# Patient Record
Sex: Female | Born: 1945
Health system: Southern US, Community
[De-identification: ages and names within clinical notes are randomized; demographics above are authoritative.]

## PROBLEM LIST (undated history)

## (undated) DIAGNOSIS — I672 Cerebral atherosclerosis: Secondary | ICD-10-CM

## (undated) DIAGNOSIS — G9340 Encephalopathy, unspecified: Secondary | ICD-10-CM

## (undated) DIAGNOSIS — K2960 Other gastritis without bleeding: Secondary | ICD-10-CM

## (undated) DIAGNOSIS — R631 Polydipsia: Secondary | ICD-10-CM

## (undated) DIAGNOSIS — F419 Anxiety disorder, unspecified: Secondary | ICD-10-CM

## (undated) DIAGNOSIS — I8310 Varicose veins of unspecified lower extremity with inflammation: Secondary | ICD-10-CM

## (undated) DIAGNOSIS — K5190 Ulcerative colitis, unspecified, without complications: Secondary | ICD-10-CM

## (undated) DIAGNOSIS — G4700 Insomnia, unspecified: Secondary | ICD-10-CM

## (undated) DIAGNOSIS — Z8673 Personal history of transient ischemic attack (TIA), and cerebral infarction without residual deficits: Secondary | ICD-10-CM

## (undated) DIAGNOSIS — R569 Unspecified convulsions: Secondary | ICD-10-CM

## (undated) DIAGNOSIS — I639 Cerebral infarction, unspecified: Secondary | ICD-10-CM

## (undated) DIAGNOSIS — F329 Major depressive disorder, single episode, unspecified: Secondary | ICD-10-CM

## (undated) DIAGNOSIS — G43909 Migraine, unspecified, not intractable, without status migrainosus: Secondary | ICD-10-CM

## (undated) DIAGNOSIS — F32A Depression, unspecified: Secondary | ICD-10-CM

## (undated) DIAGNOSIS — I63232 Cerebral infarction due to unspecified occlusion or stenosis of left carotid arteries: Secondary | ICD-10-CM

## (undated) DIAGNOSIS — I10 Essential (primary) hypertension: Secondary | ICD-10-CM

## (undated) DIAGNOSIS — I700 Atherosclerosis of aorta: Secondary | ICD-10-CM

## (undated) DIAGNOSIS — E785 Hyperlipidemia, unspecified: Secondary | ICD-10-CM

## (undated) DIAGNOSIS — F418 Other specified anxiety disorders: Secondary | ICD-10-CM

## (undated) DIAGNOSIS — R4701 Aphasia: Secondary | ICD-10-CM

## (undated) DIAGNOSIS — E871 Hypo-osmolality and hyponatremia: Secondary | ICD-10-CM

## (undated) DIAGNOSIS — E872 Acidosis: Secondary | ICD-10-CM

## (undated) DIAGNOSIS — F54 Psychological and behavioral factors associated with disorders or diseases classified elsewhere: Secondary | ICD-10-CM

## (undated) DIAGNOSIS — I679 Cerebrovascular disease, unspecified: Secondary | ICD-10-CM

## (undated) DIAGNOSIS — R4182 Altered mental status, unspecified: Secondary | ICD-10-CM

## (undated) HISTORY — PX: SHOULDER SURGERY: SHX246

## (undated) HISTORY — DX: Other specified anxiety disorders: F41.8

## (undated) HISTORY — DX: Polydipsia: R63.1

## (undated) HISTORY — DX: Altered mental status, unspecified: R41.82

## (undated) HISTORY — PX: OTHER SURGICAL HISTORY: SHX169

## (undated) HISTORY — DX: Atherosclerosis of aorta: I70.0

## (undated) HISTORY — DX: Cerebral infarction due to unspecified occlusion or stenosis of left carotid arteries: I63.232

## (undated) HISTORY — DX: Hyperlipidemia, unspecified: E78.5

## (undated) HISTORY — DX: Varicose veins of unspecified lower extremity with inflammation: I83.10

## (undated) HISTORY — DX: Cerebral atherosclerosis: I67.2

## (undated) HISTORY — DX: Hypo-osmolality and hyponatremia: E87.1

## (undated) HISTORY — PX: KNEE SURGERY: SHX244

## (undated) HISTORY — DX: Unspecified convulsions: R56.9

## (undated) HISTORY — DX: Ulcerative colitis, unspecified, without complications: K51.90

## (undated) HISTORY — PX: CHOLECYSTECTOMY: SHX55

## (undated) HISTORY — PX: COLOSTOMY: SHX63

## (undated) HISTORY — PX: BACK SURGERY: SHX140

## (undated) HISTORY — PX: UPPER GASTROINTESTINAL ENDOSCOPY: SHX188

## (undated) HISTORY — DX: Acidosis: E87.2

## (undated) HISTORY — DX: Insomnia, unspecified: G47.00

## (undated) HISTORY — DX: Other gastritis without bleeding: K29.60

## (undated) HISTORY — PX: ABDOMINAL HYSTERECTOMY: SHX81

## (undated) HISTORY — PX: ELBOW SURGERY: SHX618

## (undated) HISTORY — DX: Psychological and behavioral factors associated with disorders or diseases classified elsewhere: F54

## (undated) HISTORY — DX: Cerebrovascular disease, unspecified: I67.9

## (undated) HISTORY — DX: Migraine, unspecified, not intractable, without status migrainosus: G43.909

## (undated) HISTORY — DX: Personal history of transient ischemic attack (TIA), and cerebral infarction without residual deficits: Z86.73

## (undated) HISTORY — DX: Aphasia: R47.01

## (undated) HISTORY — DX: Encephalopathy, unspecified: G93.40

---

## 1988-12-31 HISTORY — PX: BACK SURGERY: SHX140

## 1998-04-25 ENCOUNTER — Ambulatory Visit (HOSPITAL_BASED_OUTPATIENT_CLINIC_OR_DEPARTMENT_OTHER): Admission: RE | Admit: 1998-04-25 | Discharge: 1998-04-25 | Payer: Self-pay | Admitting: Orthopedic Surgery

## 2000-06-18 ENCOUNTER — Other Ambulatory Visit: Admission: RE | Admit: 2000-06-18 | Discharge: 2000-06-18 | Payer: Self-pay | Admitting: Gynecology

## 2002-08-17 ENCOUNTER — Other Ambulatory Visit: Admission: RE | Admit: 2002-08-17 | Discharge: 2002-08-17 | Payer: Self-pay | Admitting: Obstetrics and Gynecology

## 2002-12-31 HISTORY — PX: SHOULDER SURGERY: SHX246

## 2003-02-15 ENCOUNTER — Ambulatory Visit (HOSPITAL_BASED_OUTPATIENT_CLINIC_OR_DEPARTMENT_OTHER): Admission: RE | Admit: 2003-02-15 | Discharge: 2003-02-15 | Payer: Self-pay | Admitting: Orthopedic Surgery

## 2004-08-25 ENCOUNTER — Other Ambulatory Visit: Admission: RE | Admit: 2004-08-25 | Discharge: 2004-08-25 | Payer: Self-pay | Admitting: Obstetrics and Gynecology

## 2014-09-30 DIAGNOSIS — I639 Cerebral infarction, unspecified: Secondary | ICD-10-CM

## 2014-09-30 HISTORY — DX: Cerebral infarction, unspecified: I63.9

## 2014-11-12 DIAGNOSIS — I63232 Cerebral infarction due to unspecified occlusion or stenosis of left carotid arteries: Secondary | ICD-10-CM

## 2014-11-12 DIAGNOSIS — G43909 Migraine, unspecified, not intractable, without status migrainosus: Secondary | ICD-10-CM

## 2014-11-12 HISTORY — DX: Migraine, unspecified, not intractable, without status migrainosus: G43.909

## 2014-11-12 HISTORY — DX: Cerebral infarction due to unspecified occlusion or stenosis of left carotid arteries: I63.232

## 2015-01-18 DIAGNOSIS — H01004 Unspecified blepharitis left upper eyelid: Secondary | ICD-10-CM | POA: Diagnosis not present

## 2015-06-13 DIAGNOSIS — H53453 Other localized visual field defect, bilateral: Secondary | ICD-10-CM | POA: Diagnosis not present

## 2015-07-10 DIAGNOSIS — I10 Essential (primary) hypertension: Secondary | ICD-10-CM | POA: Diagnosis not present

## 2015-07-10 DIAGNOSIS — R072 Precordial pain: Secondary | ICD-10-CM | POA: Diagnosis not present

## 2015-07-10 DIAGNOSIS — E780 Pure hypercholesterolemia: Secondary | ICD-10-CM | POA: Diagnosis not present

## 2015-07-10 DIAGNOSIS — K219 Gastro-esophageal reflux disease without esophagitis: Secondary | ICD-10-CM | POA: Diagnosis not present

## 2015-07-10 DIAGNOSIS — I6930 Unspecified sequelae of cerebral infarction: Secondary | ICD-10-CM | POA: Diagnosis not present

## 2015-07-10 DIAGNOSIS — Z7982 Long term (current) use of aspirin: Secondary | ICD-10-CM | POA: Diagnosis not present

## 2015-07-10 DIAGNOSIS — Z87891 Personal history of nicotine dependence: Secondary | ICD-10-CM | POA: Diagnosis not present

## 2015-07-10 DIAGNOSIS — R079 Chest pain, unspecified: Secondary | ICD-10-CM | POA: Diagnosis not present

## 2015-07-10 DIAGNOSIS — Z79899 Other long term (current) drug therapy: Secondary | ICD-10-CM | POA: Diagnosis not present

## 2015-07-11 DIAGNOSIS — R079 Chest pain, unspecified: Secondary | ICD-10-CM | POA: Diagnosis not present

## 2015-07-11 DIAGNOSIS — K219 Gastro-esophageal reflux disease without esophagitis: Secondary | ICD-10-CM | POA: Diagnosis not present

## 2015-07-11 DIAGNOSIS — I6930 Unspecified sequelae of cerebral infarction: Secondary | ICD-10-CM | POA: Diagnosis not present

## 2015-07-11 DIAGNOSIS — E780 Pure hypercholesterolemia: Secondary | ICD-10-CM | POA: Diagnosis not present

## 2015-07-14 DIAGNOSIS — G43709 Chronic migraine without aura, not intractable, without status migrainosus: Secondary | ICD-10-CM | POA: Diagnosis not present

## 2015-07-14 DIAGNOSIS — I63232 Cerebral infarction due to unspecified occlusion or stenosis of left carotid arteries: Secondary | ICD-10-CM | POA: Diagnosis not present

## 2015-07-14 DIAGNOSIS — R42 Dizziness and giddiness: Secondary | ICD-10-CM | POA: Diagnosis not present

## 2015-07-25 DIAGNOSIS — R748 Abnormal levels of other serum enzymes: Secondary | ICD-10-CM | POA: Diagnosis not present

## 2015-07-25 DIAGNOSIS — E780 Pure hypercholesterolemia: Secondary | ICD-10-CM | POA: Diagnosis not present

## 2015-07-25 DIAGNOSIS — R0789 Other chest pain: Secondary | ICD-10-CM | POA: Diagnosis not present

## 2015-07-25 DIAGNOSIS — I10 Essential (primary) hypertension: Secondary | ICD-10-CM | POA: Diagnosis not present

## 2015-07-30 DIAGNOSIS — K760 Fatty (change of) liver, not elsewhere classified: Secondary | ICD-10-CM | POA: Diagnosis not present

## 2015-07-30 DIAGNOSIS — Z9049 Acquired absence of other specified parts of digestive tract: Secondary | ICD-10-CM | POA: Diagnosis not present

## 2015-10-31 DIAGNOSIS — F419 Anxiety disorder, unspecified: Secondary | ICD-10-CM | POA: Diagnosis not present

## 2015-11-07 DIAGNOSIS — G43709 Chronic migraine without aura, not intractable, without status migrainosus: Secondary | ICD-10-CM | POA: Diagnosis not present

## 2015-11-07 DIAGNOSIS — I63232 Cerebral infarction due to unspecified occlusion or stenosis of left carotid arteries: Secondary | ICD-10-CM | POA: Diagnosis not present

## 2015-11-08 DIAGNOSIS — F419 Anxiety disorder, unspecified: Secondary | ICD-10-CM | POA: Diagnosis not present

## 2015-11-22 DIAGNOSIS — F419 Anxiety disorder, unspecified: Secondary | ICD-10-CM | POA: Diagnosis not present

## 2015-11-22 DIAGNOSIS — F410 Panic disorder [episodic paroxysmal anxiety] without agoraphobia: Secondary | ICD-10-CM | POA: Diagnosis not present

## 2015-12-02 DIAGNOSIS — F419 Anxiety disorder, unspecified: Secondary | ICD-10-CM | POA: Diagnosis not present

## 2015-12-05 DIAGNOSIS — F419 Anxiety disorder, unspecified: Secondary | ICD-10-CM | POA: Diagnosis not present

## 2015-12-08 ENCOUNTER — Inpatient Hospital Stay (HOSPITAL_COMMUNITY): Payer: Medicare PPO

## 2015-12-08 ENCOUNTER — Encounter (HOSPITAL_COMMUNITY): Payer: Self-pay | Admitting: *Deleted

## 2015-12-08 ENCOUNTER — Emergency Department (HOSPITAL_COMMUNITY): Payer: Medicare PPO

## 2015-12-08 ENCOUNTER — Inpatient Hospital Stay (HOSPITAL_COMMUNITY)
Admission: EM | Admit: 2015-12-08 | Discharge: 2015-12-13 | DRG: 101 | Disposition: A | Discharge status: Home Health | Payer: Medicare PPO | Attending: Internal Medicine | Admitting: Internal Medicine

## 2015-12-08 DIAGNOSIS — R4182 Altered mental status, unspecified: Secondary | ICD-10-CM | POA: Diagnosis not present

## 2015-12-08 DIAGNOSIS — F419 Anxiety disorder, unspecified: Secondary | ICD-10-CM | POA: Diagnosis present

## 2015-12-08 DIAGNOSIS — S01512A Laceration without foreign body of oral cavity, initial encounter: Secondary | ICD-10-CM | POA: Diagnosis not present

## 2015-12-08 DIAGNOSIS — N179 Acute kidney failure, unspecified: Secondary | ICD-10-CM | POA: Diagnosis present

## 2015-12-08 DIAGNOSIS — R4701 Aphasia: Secondary | ICD-10-CM | POA: Diagnosis not present

## 2015-12-08 DIAGNOSIS — F418 Other specified anxiety disorders: Secondary | ICD-10-CM | POA: Diagnosis not present

## 2015-12-08 DIAGNOSIS — I6789 Other cerebrovascular disease: Secondary | ICD-10-CM | POA: Diagnosis not present

## 2015-12-08 DIAGNOSIS — E871 Hypo-osmolality and hyponatremia: Secondary | ICD-10-CM

## 2015-12-08 DIAGNOSIS — F54 Psychological and behavioral factors associated with disorders or diseases classified elsewhere: Secondary | ICD-10-CM

## 2015-12-08 DIAGNOSIS — Z7982 Long term (current) use of aspirin: Secondary | ICD-10-CM | POA: Diagnosis not present

## 2015-12-08 DIAGNOSIS — I6782 Cerebral ischemia: Secondary | ICD-10-CM | POA: Diagnosis not present

## 2015-12-08 DIAGNOSIS — D72829 Elevated white blood cell count, unspecified: Secondary | ICD-10-CM | POA: Diagnosis not present

## 2015-12-08 DIAGNOSIS — F329 Major depressive disorder, single episode, unspecified: Secondary | ICD-10-CM | POA: Diagnosis present

## 2015-12-08 DIAGNOSIS — E872 Acidosis, unspecified: Secondary | ICD-10-CM

## 2015-12-08 DIAGNOSIS — R569 Unspecified convulsions: Secondary | ICD-10-CM

## 2015-12-08 DIAGNOSIS — R400 Somnolence: Secondary | ICD-10-CM | POA: Diagnosis not present

## 2015-12-08 DIAGNOSIS — G9340 Encephalopathy, unspecified: Secondary | ICD-10-CM | POA: Diagnosis present

## 2015-12-08 DIAGNOSIS — X58XXXA Exposure to other specified factors, initial encounter: Secondary | ICD-10-CM | POA: Diagnosis present

## 2015-12-08 DIAGNOSIS — I10 Essential (primary) hypertension: Secondary | ICD-10-CM | POA: Diagnosis present

## 2015-12-08 DIAGNOSIS — I69319 Unspecified symptoms and signs involving cognitive functions following cerebral infarction: Secondary | ICD-10-CM

## 2015-12-08 DIAGNOSIS — M6282 Rhabdomyolysis: Secondary | ICD-10-CM | POA: Diagnosis present

## 2015-12-08 DIAGNOSIS — R0602 Shortness of breath: Secondary | ICD-10-CM

## 2015-12-08 DIAGNOSIS — E860 Dehydration: Secondary | ICD-10-CM | POA: Diagnosis present

## 2015-12-08 DIAGNOSIS — Z7902 Long term (current) use of antithrombotics/antiplatelets: Secondary | ICD-10-CM | POA: Diagnosis not present

## 2015-12-08 DIAGNOSIS — G40901 Epilepsy, unspecified, not intractable, with status epilepticus: Principal | ICD-10-CM | POA: Diagnosis present

## 2015-12-08 DIAGNOSIS — R402421 Glasgow coma scale score 9-12, in the field [EMT or ambulance]: Secondary | ICD-10-CM | POA: Diagnosis not present

## 2015-12-08 DIAGNOSIS — R401 Stupor: Secondary | ICD-10-CM | POA: Diagnosis not present

## 2015-12-08 DIAGNOSIS — R631 Polydipsia: Secondary | ICD-10-CM | POA: Diagnosis present

## 2015-12-08 DIAGNOSIS — Z79899 Other long term (current) drug therapy: Secondary | ICD-10-CM | POA: Diagnosis not present

## 2015-12-08 HISTORY — DX: Other specified anxiety disorders: F41.8

## 2015-12-08 HISTORY — DX: Major depressive disorder, single episode, unspecified: F32.9

## 2015-12-08 HISTORY — DX: Hypo-osmolality and hyponatremia: E87.1

## 2015-12-08 HISTORY — DX: Depression, unspecified: F32.A

## 2015-12-08 HISTORY — DX: Acidosis: E87.2

## 2015-12-08 HISTORY — DX: Essential (primary) hypertension: I10

## 2015-12-08 HISTORY — DX: Cerebral infarction, unspecified: I63.9

## 2015-12-08 HISTORY — DX: Acidosis, unspecified: E87.20

## 2015-12-08 HISTORY — DX: Anxiety disorder, unspecified: F41.9

## 2015-12-08 HISTORY — DX: Encephalopathy, unspecified: G93.40

## 2015-12-08 HISTORY — DX: Psychological and behavioral factors associated with disorders or diseases classified elsewhere: F54

## 2015-12-08 HISTORY — DX: Altered mental status, unspecified: R41.82

## 2015-12-08 LAB — ETHANOL

## 2015-12-08 LAB — COMPREHENSIVE METABOLIC PANEL
ALT: 57 U/L — AB (ref 14–54)
AST: 42 U/L — AB (ref 15–41)
Albumin: 4.5 g/dL (ref 3.5–5.0)
Alkaline Phosphatase: 79 U/L (ref 38–126)
Anion gap: 13 (ref 5–15)
BILIRUBIN TOTAL: 0.5 mg/dL (ref 0.3–1.2)
BUN: 13 mg/dL (ref 6–20)
CALCIUM: 9.5 mg/dL (ref 8.9–10.3)
CO2: 20 mmol/L — ABNORMAL LOW (ref 22–32)
CREATININE: 0.8 mg/dL (ref 0.44–1.00)
Chloride: 88 mmol/L — ABNORMAL LOW (ref 101–111)
GFR calc Af Amer: >60 mL/min (ref >60)
Glucose, Bld: 201 mg/dL — ABNORMAL HIGH (ref 65–99)
Potassium: 3.8 mmol/L (ref 3.5–5.1)
SODIUM: 121 mmol/L — AB (ref 135–145)
TOTAL PROTEIN: 8.2 g/dL — AB (ref 6.5–8.1)

## 2015-12-08 LAB — URINALYSIS, ROUTINE W REFLEX MICROSCOPIC
Bilirubin Urine: NEGATIVE
GLUCOSE, UA: 100 mg/dL — AB
Ketones, ur: NEGATIVE mg/dL
LEUKOCYTES UA: NEGATIVE
Nitrite: NEGATIVE
PH: 5.5 (ref 5.0–8.0)
PROTEIN: NEGATIVE mg/dL
Specific Gravity, Urine: 1.011 (ref 1.005–1.030)

## 2015-12-08 LAB — CBC
HCT: 33.7 % — ABNORMAL LOW (ref 36.0–46.0)
Hemoglobin: 12.3 g/dL (ref 12.0–15.0)
MCH: 29.8 pg (ref 26.0–34.0)
MCHC: 36.5 g/dL — AB (ref 30.0–36.0)
MCV: 81.6 fL (ref 78.0–100.0)
Platelets: 337 10*3/uL (ref 150–400)
RBC: 4.13 MIL/uL (ref 3.87–5.11)
RDW: 12.2 % (ref 11.5–15.5)
WBC: 16.6 10*3/uL — ABNORMAL HIGH (ref 4.0–10.5)

## 2015-12-08 LAB — URINE MICROSCOPIC-ADD ON: WBC, UA: NONE SEEN WBC/hpf (ref 0–5)

## 2015-12-08 LAB — POCT I-STAT TROPONIN I: TROPONIN I, POC: 0.01 ng/mL (ref 0.00–0.08)

## 2015-12-08 LAB — I-STAT CHEM 8, ED
BUN: 16 mg/dL (ref 6–20)
CALCIUM ION: 1.12 mmol/L — AB (ref 1.13–1.30)
Chloride: 88 mmol/L — ABNORMAL LOW (ref 101–111)
Creatinine, Ser: 0.6 mg/dL (ref 0.44–1.00)
GLUCOSE: 203 mg/dL — AB (ref 65–99)
HCT: 44 % (ref 36.0–46.0)
HEMOGLOBIN: 15 g/dL (ref 12.0–15.0)
POTASSIUM: 3.9 mmol/L (ref 3.5–5.1)
Sodium: 123 mmol/L — ABNORMAL LOW (ref 135–145)
TCO2: 21 mmol/L (ref 0–100)

## 2015-12-08 LAB — CBC WITH DIFFERENTIAL/PLATELET
BASOS ABS: 0 10*3/uL (ref 0.0–0.1)
Basophils Relative: 0 %
EOS ABS: 0 10*3/uL (ref 0.0–0.7)
EOS PCT: 0 %
HCT: 37.1 % (ref 36.0–46.0)
Hemoglobin: 13.2 g/dL (ref 12.0–15.0)
LYMPHS ABS: 1.3 10*3/uL (ref 0.7–4.0)
LYMPHS PCT: 9 %
MCH: 29 pg (ref 26.0–34.0)
MCHC: 35.6 g/dL (ref 30.0–36.0)
MCV: 81.5 fL (ref 78.0–100.0)
MONO ABS: 0.8 10*3/uL (ref 0.1–1.0)
Monocytes Relative: 5 %
Neutro Abs: 12.7 10*3/uL — ABNORMAL HIGH (ref 1.7–7.7)
Neutrophils Relative %: 86 %
PLATELETS: 366 10*3/uL (ref 150–400)
RBC: 4.55 MIL/uL (ref 3.87–5.11)
RDW: 12 % (ref 11.5–15.5)
WBC: 14.8 10*3/uL — AB (ref 4.0–10.5)

## 2015-12-08 LAB — I-STAT VENOUS BLOOD GAS, ED
Acid-base deficit: 3 mmol/L — ABNORMAL HIGH (ref 0.0–2.0)
BICARBONATE: 22.2 meq/L (ref 20.0–24.0)
O2 Saturation: 90 %
PCO2 VEN: 39.6 mmHg — AB (ref 45.0–50.0)
PH VEN: 7.358 — AB (ref 7.250–7.300)
PO2 VEN: 60 mmHg — AB (ref 30.0–45.0)
TCO2: 23 mmol/L (ref 0–100)

## 2015-12-08 LAB — I-STAT CG4 LACTIC ACID, ED
LACTIC ACID, VENOUS: 3.96 mmol/L — AB (ref 0.5–2.0)
LACTIC ACID, VENOUS: 4.59 mmol/L — AB (ref 0.5–2.0)

## 2015-12-08 LAB — RAPID URINE DRUG SCREEN, HOSP PERFORMED
AMPHETAMINES: NOT DETECTED
BENZODIAZEPINES: NOT DETECTED
Barbiturates: NOT DETECTED
Cocaine: NOT DETECTED
Opiates: NOT DETECTED
TETRAHYDROCANNABINOL: NOT DETECTED

## 2015-12-08 LAB — T4, FREE: Free T4: 1.01 ng/dL (ref 0.61–1.12)

## 2015-12-08 LAB — AMMONIA: AMMONIA: 25 umol/L (ref 9–35)

## 2015-12-08 LAB — I-STAT TROPONIN, ED
TROPONIN I, POC: 0.01 ng/mL (ref 0.00–0.08)
Troponin i, poc: 0 ng/mL (ref 0.00–0.08)

## 2015-12-08 LAB — CARBOXYHEMOGLOBIN
Carboxyhemoglobin: 0.8 % (ref 0.5–1.5)
Methemoglobin: 1.1 % (ref 0.0–1.5)
O2 Saturation: 90.2 %
Total hemoglobin: 13.6 g/dL (ref 12.0–16.0)

## 2015-12-08 LAB — ACETAMINOPHEN LEVEL: Acetaminophen (Tylenol), Serum: <10 ug/mL — ABNORMAL LOW (ref 10–30)

## 2015-12-08 LAB — SALICYLATE LEVEL: Salicylate Lvl: <4 mg/dL (ref 2.8–30.0)

## 2015-12-08 LAB — CBG MONITORING, ED: Glucose-Capillary: 169 mg/dL — ABNORMAL HIGH (ref 65–99)

## 2015-12-08 LAB — TSH: TSH: 2.823 u[IU]/mL (ref 0.350–4.500)

## 2015-12-08 MED ORDER — PANTOPRAZOLE SODIUM 40 MG PO TBEC
40.0000 mg | DELAYED_RELEASE_TABLET | Freq: Every day | ORAL | Status: DC
  Administered 2015-12-09 – 2015-12-13 (×5): 40 mg via ORAL
  Filled 2015-12-08 (×5): qty 1

## 2015-12-08 MED ORDER — VANCOMYCIN HCL 10 G IV SOLR
1250.0000 mg | Freq: Once | INTRAVENOUS | Status: AC
  Administered 2015-12-08: 1250 mg via INTRAVENOUS
  Filled 2015-12-08: qty 1250

## 2015-12-08 MED ORDER — SODIUM CHLORIDE 0.9 % IV BOLUS (SEPSIS)
1000.0000 mL | Freq: Once | INTRAVENOUS | Status: AC
  Administered 2015-12-08: 1000 mL via INTRAVENOUS

## 2015-12-08 MED ORDER — SERTRALINE HCL 50 MG PO TABS
50.0000 mg | ORAL_TABLET | Freq: Every day | ORAL | Status: DC
  Administered 2015-12-09 – 2015-12-13 (×5): 50 mg via ORAL
  Filled 2015-12-08 (×4): qty 1

## 2015-12-08 MED ORDER — VANCOMYCIN HCL IN DEXTROSE 750-5 MG/150ML-% IV SOLN
750.0000 mg | Freq: Two times a day (BID) | INTRAVENOUS | Status: DC
  Filled 2015-12-08: qty 150

## 2015-12-08 MED ORDER — NALOXONE HCL 0.4 MG/ML IJ SOLN
0.4000 mg | Freq: Once | INTRAMUSCULAR | Status: AC
  Administered 2015-12-08: 0.4 mg via INTRAVENOUS
  Filled 2015-12-08: qty 1

## 2015-12-08 MED ORDER — CLOPIDOGREL BISULFATE 75 MG PO TABS
75.0000 mg | ORAL_TABLET | Freq: Every day | ORAL | Status: DC
  Administered 2015-12-09 – 2015-12-13 (×5): 75 mg via ORAL
  Filled 2015-12-08 (×5): qty 1

## 2015-12-08 MED ORDER — ASPIRIN 325 MG PO TABS
325.0000 mg | ORAL_TABLET | Freq: Every day | ORAL | Status: DC
  Administered 2015-12-09 – 2015-12-10 (×2): 325 mg via ORAL
  Filled 2015-12-08 (×2): qty 1

## 2015-12-08 MED ORDER — VANCOMYCIN HCL IN DEXTROSE 1-5 GM/200ML-% IV SOLN: 1000.0000 mg | Freq: Once | INTRAVENOUS | Status: DC

## 2015-12-08 MED ORDER — POTASSIUM CHLORIDE IN NACL 20-0.9 MEQ/L-% IV SOLN
INTRAVENOUS | Status: DC
  Administered 2015-12-08: via INTRAVENOUS
  Filled 2015-12-08: qty 1000

## 2015-12-08 MED ORDER — SODIUM CHLORIDE 0.9 % IV SOLN
1000.0000 mg | Freq: Once | INTRAVENOUS | Status: AC
  Administered 2015-12-09: 1000 mg via INTRAVENOUS
  Filled 2015-12-08: qty 10

## 2015-12-08 MED ORDER — ENOXAPARIN SODIUM 40 MG/0.4ML ~~LOC~~ SOLN
40.0000 mg | SUBCUTANEOUS | Status: DC
  Administered 2015-12-09 – 2015-12-12 (×4): 40 mg via SUBCUTANEOUS
  Filled 2015-12-08 (×5): qty 0.4

## 2015-12-08 MED ORDER — PRAVASTATIN SODIUM 40 MG PO TABS
40.0000 mg | ORAL_TABLET | Freq: Every day | ORAL | Status: DC
  Administered 2015-12-09 – 2015-12-11 (×3): 40 mg via ORAL
  Filled 2015-12-08 (×3): qty 1

## 2015-12-08 MED ORDER — PIPERACILLIN-TAZOBACTAM 3.375 G IVPB 30 MIN
3.3750 g | Freq: Once | INTRAVENOUS | Status: AC
  Administered 2015-12-08: 3.375 g via INTRAVENOUS
  Filled 2015-12-08: qty 50

## 2015-12-08 MED ORDER — SENNOSIDES-DOCUSATE SODIUM 8.6-50 MG PO TABS: 1.0000 | ORAL_TABLET | Freq: Every evening | ORAL | Status: DC | PRN

## 2015-12-08 MED ORDER — INSULIN ASPART 100 UNIT/ML ~~LOC~~ SOLN
0.0000 [IU] | Freq: Three times a day (TID) | SUBCUTANEOUS | Status: DC
  Administered 2015-12-09: 1 [IU] via SUBCUTANEOUS
  Administered 2015-12-10: 2 [IU] via SUBCUTANEOUS

## 2015-12-08 NOTE — H&P (Signed)
[facility] History and Physical  Samantha Wilkinson EGB:[id number] DOB: [birth year] DOA: 12/08/2015  Referring physician: ED physician PCP: No primary care provider on file.  Specialists: Dr. Metta Clines, neurologist in [place]  Chief Complaint:   HPI: Samantha Wilkinson is a 69 y.o. female with PMH of ischemic stroke with mild residual cognitive difficulties, hypertension, and 6 weeks of disabling depression and anxiety who now presents via EMS with altered mental status, initially not following commands but opening eyes to verbal stimuli. Per EMS report, she had a tongue laceration and urinary incontinence upon their arrival. Her husband and daughter-in-law are at the bedside and provide the history. Samantha Wilkinson suffered an ischemic CVA in October 2015 and his since had difficulty with memory and decision making, but has remained socially engaged and physically active until 10/30/2015. At this time, per report, she began to have bouts of severe anxiety lasting 12-24 hours at a time. These episodes have been coming more frequent, now occurring every 2-3 days. She was started on Zoloft and Ativan in November by her neurologist, Dr. Metta Clines, but unfortunately had no appreciable improvement. She saw a psychiatrist on December 5 of this year and was prescribed Seroquel when necessary for anxiety. The Seroquel and Ativan have reportedly put her to sleep, but upon wakening, her daughter-in-law describes an unusually high level of energy with grandiose plans lasting about 24 hours and during which the patient does not sleep or eat. Per the family's report, there is been no fevers, cough, pain complaints, or diarrhea.  In ED, patient was found to be afebrile, saturating well on room air, and with vital signs stable. She has been nonverbal in the emergency department that has followed simple commands. Her family is reportedly witnessed for episodes involving violent jerking of all 4 extremities, lasting for about a minute,  and followed by lethargy. She apparently bit her tongue again during one of these episodes. Initial workup has revealed a mild leukocytosis, lactic acid of 4, and a serum sodium of 121. CT of the head was notable for chronic ischemic changes but no acute findings were reported. Neurology was consulted from the emergency department and is currently seeing the patient and left the recommendations. Samantha Wilkinson will be admitted to the stepdown unit for ongoing evaluation and management of altered mental status suspected secondary to CVA, or more likely seizure in the setting of hyponatremia.  Where does patient live?   At home    Can patient participate in ADLs?   Some   Review of Systems:  Unable to obtain secondary to the patient's clinical condition.  Allergy:  Allergies  Allergen Reactions  . Codeine     Past Medical History  Diagnosis Date  . Hypertension   . Stroke [facility]) october 2015  . Anxiety   . Depression     History reviewed. No pertinent past surgical history.  Social History: Unable to obtain secondary to the patient's clinical condition  Family History:  Family History  Problem Relation Age of Onset  . Seizures Sister      Prior to Admission medications   Medication Sig Start Date End Date Taking? Authorizing Provider  aspirin EC 81 MG tablet Take 81 mg by mouth daily.   Yes Historical Provider, MD  cetirizine (ZYRTEC) 10 MG tablet Take 10 mg by mouth daily.   Yes Historical Provider, MD  clopidogrel (PLAVIX) 75 MG tablet Take 75 mg by mouth daily.   Yes Historical Provider, MD  LORazepam (ATIVAN) 1 MG  tablet Take 1 mg by mouth every 8 (eight) hours as needed for anxiety.   Yes Historical Provider, MD  magnesium gluconate (MAGONATE) 500 MG tablet Take 500 mg by mouth daily.   Yes Historical Provider, MD  meclizine (ANTIVERT) 25 MG tablet Take 25 mg by mouth daily.   Yes Historical Provider, MD  Omega-3 Fatty Acids (FISH OIL) 1200 MG CAPS Take 1,200 mg by mouth daily.    Yes Historical Provider, MD  omeprazole (PRILOSEC) 20 MG capsule Take 20 mg by mouth daily.   Yes Historical Provider, MD  OVER THE COUNTER MEDICATION Place 1 drop into both eyes daily.   Yes Historical Provider, MD  Pitavastatin Calcium (LIVALO) 4 MG TABS Take 4 mg by mouth at bedtime.   Yes Historical Provider, MD  QUEtiapine (SEROQUEL) 25 MG tablet Take 25 mg by mouth daily as needed (anxiety attacks).   Yes Historical Provider, MD  sertraline (ZOLOFT) 50 MG tablet Take 50 mg by mouth daily.   Yes Historical Provider, MD  valsartan-hydrochlorothiazide (DIOVAN-HCT) 320-12.5 MG tablet Take 1 tablet by mouth daily.   Yes Historical Provider, MD    Physical Exam: Filed Vitals:   12/08/15 2010 12/08/15 2030 12/08/15 2100 12/08/15 2130  BP: 138/79 132/79 131/77 140/79  Pulse: 105 106 106 104  Temp:      TempSrc:      Resp: 19 22 20 21   SpO2: 96% 97% 96% 96%   General: Not in acute respiratory distress HEENT:       Eyes:   scleral icterus or conjunctival pallor.       ENT: Right lateral tongue laceration No discharge from the ears or nose, no pharyngeal ulcers, petechiae or exudate, no tonsillar enlargement.        Neck: No JVD, no bruit, no appreciable mass Heme: No cervical adenopathy, no pallor Cardiac: S1/S2, RRR, No murmurs, No gallops or rubs. Pulm: Good air movement bilaterally. No rales, wheezing, rhonchi or rubs. Abd: Soft, nondistended, nontender, no rebound pain or gaurding, no mass or organomegaly, BS present. Ext: No LE edema bilaterally. 2+DP/PT pulse bilaterally. Musculoskeletal: No gross deformity, no red, hot, swollen joints, no limitation in ROM  Skin: No rashes or wounds on exposed surfaces  Neuro: lethargic and not verbally responsive, but following commands. Pupils are equal round reactive to light, tongue protrusion and uvula are midline, brachial and patellar reflexes are 2+ bilaterally, Babinski's sign is negative, and muscle tone is normal Psych: Patient is not  overtly psychotic, denies suicidal or homocidal ideation, no active hallucinations.  Labs on Admission:  Basic Metabolic Panel:  Recent Labs Lab 12/08/15 1540 12/08/15 1614  NA 121* 123*  K 3.8 3.9  CL 88* 88*  CO2 20*  --   GLUCOSE 201* 203*  BUN 13 16  CREATININE 0.80 0.60  CALCIUM 9.5  --    Liver Function Tests:  Recent Labs Lab 12/08/15 1540  AST 42*  ALT 57*  ALKPHOS 79  BILITOT 0.5  PROT 8.2*  ALBUMIN 4.5   No results for input(s): LIPASE, AMYLASE in the last 168 hours.  Recent Labs Lab 12/08/15 1540  AMMONIA 25   CBC:  Recent Labs Lab 12/08/15 1540 12/08/15 1614  WBC 14.8*  --   NEUTROABS 12.7*  --   HGB 13.2 15.0  HCT 37.1 44.0  MCV 81.5  --   PLT 366  --    Cardiac Enzymes: No results for input(s): CKTOTAL, CKMB, CKMBINDEX, TROPONINI in the last 168 hours.  BNP (last 3 results) No results for input(s): BNP in the last 8760 hours.  ProBNP (last 3 results) No results for input(s): PROBNP in the last 8760 hours.  CBG:  Recent Labs Lab 12/08/15 1647  GLUCAP 169*    Radiological Exams on Admission: Ct Head Wo Contrast  12/08/2015  CLINICAL DATA:  Altered mental status EXAM: CT HEAD WITHOUT CONTRAST TECHNIQUE: Contiguous axial images were obtained from the base of the skull through the vertex without intravenous contrast. COMPARISON:  02/04/2015 FINDINGS: The bony calvarium is intact. Prior lacunar infarct is noted on the right in the region of the basal ganglia stable in appearance from the prior study. No findings to suggest acute hemorrhage, acute infarction or space-occupying mass lesion are noted. IMPRESSION: Chronic ischemic change without acute abnormality. Electronically Signed   By: Inez Catalina M.D.   On: 12/08/2015 15:06   Mr Brain Wo Contrast  12/08/2015  CLINICAL DATA:  Altered mental status. Found down. Unable to speak. EXAM: MRI HEAD WITHOUT CONTRAST TECHNIQUE: Multiplanar, multiecho pulse sequences of the brain and  surrounding structures were obtained without intravenous contrast. COMPARISON:  CT head earlier today.  MR head 10/21/2014. FINDINGS: No evidence for acute infarction, hemorrhage, mass lesion, hydrocephalus, or extra-axial fluid. Mild cerebral and cerebellar atrophy. Minor white matter disease. Remote RIGHT MCA territory infarction affecting the lentiform nucleus and periventricular white matter, with significant encephalomalacia and gliosis. Smaller remote LEFT occipital infarct, also without hemorrhage. No features suggestive of carbon monoxide poisoning. Flow voids are maintained in the carotid, basilar, and both vertebral arteries. No midline abnormality of significance. Extracranial soft tissues appear unremarkable. Good general agreement with recent CT. Compared with prior MR in 2015, LEFT occipital lobe infarct was acute. IMPRESSION: Evidence for chronic cerebral ischemia. No acute intracranial findings. Electronically Signed   By: Staci Righter M.D.   On: 12/08/2015 20:14   Dg Chest Portable 1 View  12/08/2015  CLINICAL DATA:  69 year old female with altered mental status. Hypertension. Initial encounter. EXAM: PORTABLE CHEST 1 VIEW COMPARISON:  07/10/2015. FINDINGS: Mild central pulmonary vascular prominence. No segmental consolidation or pneumothorax. Heart size top-normal. Limited evaluation mediastinal structures without gross abnormality. Mild acromioclavicular joint degenerative changes. IMPRESSION: Mild central pulmonary vascular prominence. No infiltrate or pneumothorax. Electronically Signed   By: Genia Del M.D.   On: 12/08/2015 15:36    EKG: Independently reviewed.  Abnormal findings:   NSR, non-specific Tw abnormality   Assessment/Plan 1. Altered mental status.  Suspect seizure, possibly secondary to hyponatremia. Acute CVA less likely. Infectious process is also considered given leukocytosis and elevated lactic acid.  - Neurology is on the case and have ordered MRI brain, EEG, and  Keppra loading dose; their consultation much appreciated  - Witnessed tonic-clonic activity, tongue laceration, and reported to the incontinence in the field favor a diagnosis of seizure. - Ammonia, thyroid studies, an infectious disease workup is pending  - We'll admit to stepdown unit on seizure precautions - Hypertension will be permitted until acute CVA can be ruled out with MRI - Continue the patient's Plavix, increase aspirin to 325, continue statin - Check lipid panel and A1c - Tight glycemic control and Tylenol for fevers  2. Lactic acidosis, leukocytosis - Lactic acid elevated to a value of 4.59 on admission, has come down to below 2 with administration of IV fluids - White blood cell count elevated to a value of 14,800 with absolute neutrophilia, no bands - Patient has remained afebrile with good O2 saturation on room air,  chest x-ray without apparent infiltrate, UA not suggestive of infection   - Cultures are pending - Patient started on broad-spectrum coverage given the initial diagnostic uncertainty and marked elevation in lactic acid, but can likely discontinue if cultures remain negative and patient remains afebrile  3. Hyponatremia  - Differential diagnosis favors psychogenic polydipsia given the family's report of her affinity for drinking water - Sodium of 121 on admission, with unknown chronicity as there is no prior in her system for comparison  - Suspect this will correct with the IV fluids, given unknown chronicity will aim for slow correction, specifically no more than 6-8 points per 24-hour period - Urine electrolytes and osmolality ordered for further characterization and pending at this time   4. Depression with anxiety - This reportedly just began within the last 6 weeks and has been debilitating as described in the history of present illness - May benefit from inpatient psychiatry consultation pending the patient's clinical course  5. Chronic HTN  - Allowing for  permissive hypertension until brain MRI is completed and negative for acute ischemic CVA   DVT ppx: SQ Lovenox     Code Status: Full code Family Communication: Yes, patient's husband and daughter-in-law at bed side Disposition Plan: Admit to inpatient   Date of Service 12/08/2015    Ilene Qua Opyd [facility] Pager [phone]  If 7PM-7AM, please contact night-coverage [email or web address] Password [email or web address] 12/08/2015, 10:07 PM

## 2015-12-08 NOTE — Progress Notes (Signed)
ANTIBIOTIC CONSULT NOTE - INITIAL  Pharmacy Consult for vancomycin Indication: rule out sepsis  Allergies  Allergen Reactions  . Codeine     Patient Measurements:   Adjusted Body Weight:   Vital Signs: Temp: 96.5 F (35.8 C) (12/08 1632) Temp Source: Axillary (12/08 1632) BP: 136/83 mmHg (12/08 1632) Pulse Rate: 84 (12/08 1632) Intake/Output from previous day:   Intake/Output from this shift:    Labs:  Recent Labs  12/08/15 1540 12/08/15 1614  WBC 14.8*  --   HGB 13.2 15.0  PLT 366  --   CREATININE 0.80 0.60   CrCl cannot be calculated (Unknown ideal weight.). No results for input(s): VANCOTROUGH, VANCOPEAK, VANCORANDOM, GENTTROUGH, GENTPEAK, GENTRANDOM, TOBRATROUGH, TOBRAPEAK, TOBRARND, AMIKACINPEAK, AMIKACINTROU, AMIKACIN in the last 72 hours.   Microbiology: No results found for this or any previous visit (from the past 720 hour(s)).  Medical History: Past Medical History  Diagnosis Date  . Hypertension   . Stroke [facility]) october 2015  . Anxiety     Medications:  Anti-infectives    Start     Dose/Rate Route Frequency Ordered Stop   12/09/15 0600  vancomycin (VANCOCIN) IVPB 750 mg/150 ml premix     750 mg 150 mL/hr over 60 Minutes Intravenous Every 12 hours 12/08/15 1707     12/08/15 1715  vancomycin (VANCOCIN) 1,250 mg in sodium chloride 0.9 % 250 mL IVPB     1,250 mg 166.7 mL/hr over 90 Minutes Intravenous  Once 12/08/15 1704     12/08/15 1700  piperacillin-tazobactam (ZOSYN) IVPB 3.375 g     3.375 g 100 mL/hr over 30 Minutes Intravenous  Once 12/08/15 1655     12/08/15 1700  vancomycin (VANCOCIN) IVPB 1000 mg/200 mL premix  Status:  Discontinued     1,000 mg 200 mL/hr over 60 Minutes Intravenous  Once 12/08/15 1655 12/08/15 1704     Assessment: 31 yof presented to the ED with AMS. To start broad-spectrum antibiotics with vancomycin for possible sepsis. Also ordered a 1x dose of zosyn. Pt is afebrile and WBC is elevated at 14.8. Scr is WNL and  lactic acid is elevated at 3.96.  Vanc 12/8>> Zosyn x 1 12/8  Goal of Therapy:  Vancomycin trough level 15-20 mcg/ml  Plan:  - Vancomycin 1250mg  IV x 1 then 750mg  IV Q12H - F/u renal fxn, C&S, clinical status and trough at [facility] - F/u plans to continue zosyn or other gram negative coverage  Taitum Menton, Rande Lawman 12/08/2015,5:07 PM

## 2015-12-08 NOTE — ED Notes (Signed)
Called neurology @ [phone]

## 2015-12-08 NOTE — Consult Note (Signed)
Neurology Consultation Reason for Consult: altered mental status Referring Physician: Kathrynn Humble, A  CC: Altered Mental Status   History is obtained from:Patient's family  HPI: Samantha Wilkinson is a 69 y.o. female with altered mental status of unclear etiology.  Her family states that she was last spoken to normal around 10:30 this morning and subsequently called 911 herself but was able to speak and after EMS arrival, was found to be confused. She did have a tongue bite.   She has since had some improvement, but is still not speaking. She does follow commands quite briskly and she is able to nod and shake her head to answer questions.  Of note, she was recently diagnosed with panic attacks, she would intermittently say that she felt like her head was "swimming." She would then feel like she was falling down a well will become very agitated and ring her hands.   LKW: 10:30 AM tpa given?: no, outside of window, strengthening I initially suspected    ROS:  Unable to obtain due to altered mental status.   Past Medical History  Diagnosis Date  . Hypertension   . Stroke [facility]) october 2015  . Anxiety      Family history: Unable to obtain due to altered mental status  Social History: Unable to obtain due to altered mental status   Exam: Current vital signs: BP 149/88 mmHg  Pulse 91  Temp(Src) 96.5 F (35.8 C) (Axillary)  Resp 19  SpO2 96% Vital signs in last 24 hours: Temp:  [96.5 F (35.8 C)-97.4 F (36.3 C)] 96.5 F (35.8 C) (12/08 1632) Pulse Rate:  [84-92] 91 (12/08 1700) Resp:  [18-23] 19 (12/08 1700) BP: (136-156)/(83-89) 149/88 mmHg (12/08 1700) SpO2:  [96 %-99 %] 96 % (12/08 1700)   Physical Exam  Constitutional: Appears well-developed and well-nourished.  Psych: Affect appropriate to situation Eyes: No scleral injection HENT: tongue laceration.  Head: Normocephalic.  Cardiovascular: Normal rate and regular rhythm.  Respiratory: Effort normal and breath sounds  normal to anterior ascultation GI: Soft.  No distension. There is no tenderness.  Skin: WDI  Neuro: Mental Status: Patient is awake, alert, she is able to shake her head and nod in response to questions at times. She does not speak. She briskly follows commands. Cranial Nerves: II: Blinks to threat bilaterally Pupils are equal, round, and reactive to light.   III,IV, VI: EOMI without ptosis or diploplia.  V: Facial sensation is symmetric to temperature VII: Facial movement is symmetric.  VIII: hearing is intact to voice X: Uvula elevates symmetrically XI: Shoulder shrug is symmetric. XII: tongue is midline without atrophy or fasciculations.  Motor: She appears to move all extremity is well Sensory: Responds to stimulation in all 4 extremities  Cerebellar: She does not perform  I have reviewed labs in epic and the results pertinent to this consultation are: Elevated lactate.   I have reviewed the images obtained:CT head - negative.   Impression: 69 yo F with new onset expressive aphasia. There is some concern for seizure given her tongue biting, but I am more concerned for possible ischemia given that she has a fixed deficit. I have low suspicion for status epilepticus given that she is highly responsive. A prolonged post-ictal state is possible, especially given white count. She is not febrile and does not have meningismus.   Recommendations: 1) MRI brain 2) EEG 3) Agree with assessment for other causes of ams with ammonia, TSH, blood cultures.  4) Will continue to follow.  Roland Rack, MD [facility] [phone]  If 7pm- 7am, please page neurology on call as listed in [place].

## 2015-12-08 NOTE — ED Notes (Signed)
Report attempted 
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
## 2015-12-08 NOTE — ED Notes (Signed)
Pt's CBG result was 169. Informed Stage manager.

## 2015-12-08 NOTE — Progress Notes (Signed)
Bladder scan showed 918mL of urine in pt's bladder.  Pt having pelvic pain. Dr. Myna Hidalgo notified. Order received for Foley catheter. Foley catheter inserted per hospital protocol, witnessed by Alben Spittle, RN.  Pt tolerated well.

## 2015-12-09 ENCOUNTER — Encounter (HOSPITAL_COMMUNITY): Payer: Self-pay | Admitting: General Practice

## 2015-12-09 ENCOUNTER — Inpatient Hospital Stay (HOSPITAL_COMMUNITY): Payer: Medicare PPO

## 2015-12-09 DIAGNOSIS — R4182 Altered mental status, unspecified: Secondary | ICD-10-CM

## 2015-12-09 DIAGNOSIS — R4701 Aphasia: Secondary | ICD-10-CM

## 2015-12-09 DIAGNOSIS — R569 Unspecified convulsions: Secondary | ICD-10-CM | POA: Insufficient documentation

## 2015-12-09 LAB — BASIC METABOLIC PANEL
ANION GAP: 8 (ref 5–15)
ANION GAP: 8 (ref 5–15)
Anion gap: 10 (ref 5–15)
BUN: 15 mg/dL (ref 6–20)
BUN: 14 mg/dL (ref 6–20)
BUN: 15 mg/dL (ref 6–20)
CHLORIDE: 103 mmol/L (ref 101–111)
CHLORIDE: 104 mmol/L (ref 101–111)
CHLORIDE: 99 mmol/L — AB (ref 101–111)
CO2: 19 mmol/L — AB (ref 22–32)
CO2: 20 mmol/L — ABNORMAL LOW (ref 22–32)
CO2: 21 mmol/L — AB (ref 22–32)
Calcium: 8.9 mg/dL (ref 8.9–10.3)
Calcium: 8.9 mg/dL (ref 8.9–10.3)
Calcium: 9.2 mg/dL (ref 8.9–10.3)
Creatinine, Ser: 1.17 mg/dL — ABNORMAL HIGH (ref 0.44–1.00)
Creatinine, Ser: 1.37 mg/dL — ABNORMAL HIGH (ref 0.44–1.00)
Creatinine, Ser: 1.55 mg/dL — ABNORMAL HIGH (ref 0.44–1.00)
GFR calc Af Amer: 54 mL/min — ABNORMAL LOW (ref >60)
GFR calc non Af Amer: 33 mL/min — ABNORMAL LOW (ref >60)
GFR calc non Af Amer: 39 mL/min — ABNORMAL LOW (ref >60)
GFR calc non Af Amer: 47 mL/min — ABNORMAL LOW (ref >60)
GFR, EST AFRICAN AMERICAN: 45 mL/min — AB (ref >60)
GFR, EST AFRICAN AMERICAN: 39 mL/min — AB (ref >60)
GLUCOSE: 103 mg/dL — AB (ref 65–99)
GLUCOSE: 110 mg/dL — AB (ref 65–99)
GLUCOSE: 93 mg/dL (ref 65–99)
POTASSIUM: 4.4 mmol/L (ref 3.5–5.1)
Potassium: 4.5 mmol/L (ref 3.5–5.1)
Potassium: 4.4 mmol/L (ref 3.5–5.1)
Sodium: 129 mmol/L — ABNORMAL LOW (ref 135–145)
Sodium: 130 mmol/L — ABNORMAL LOW (ref 135–145)
Sodium: 133 mmol/L — ABNORMAL LOW (ref 135–145)

## 2015-12-09 LAB — CREATININE, URINE, RANDOM: Creatinine, Urine: 27.28 mg/dL

## 2015-12-09 LAB — CHLORIDE, URINE, RANDOM: CHLORIDE URINE: 62 mmol/L

## 2015-12-09 LAB — CBC WITH DIFFERENTIAL/PLATELET
BASOS ABS: 0 10*3/uL (ref 0.0–0.1)
Basophils Relative: 0 %
Eosinophils Absolute: 0 10*3/uL (ref 0.0–0.7)
Eosinophils Relative: 0 %
HEMATOCRIT: 31.3 % — AB (ref 36.0–46.0)
HEMOGLOBIN: 11 g/dL — AB (ref 12.0–15.0)
LYMPHS PCT: 8 %
Lymphs Abs: 1.2 10*3/uL (ref 0.7–4.0)
MCH: 29 pg (ref 26.0–34.0)
MCHC: 35.1 g/dL (ref 30.0–36.0)
MCV: 82.6 fL (ref 78.0–100.0)
Monocytes Absolute: 1.1 10*3/uL — ABNORMAL HIGH (ref 0.1–1.0)
Monocytes Relative: 7 %
NEUTROS ABS: 12.8 10*3/uL — AB (ref 1.7–7.7)
NEUTROS PCT: 85 %
PLATELETS: 313 10*3/uL (ref 150–400)
RBC: 3.79 MIL/uL — AB (ref 3.87–5.11)
RDW: 12.4 % (ref 11.5–15.5)
WBC: 15.2 10*3/uL — AB (ref 4.0–10.5)

## 2015-12-09 LAB — VITAMIN B12: Vitamin B-12: 701 pg/mL (ref 180–914)

## 2015-12-09 LAB — MRSA PCR SCREENING: MRSA BY PCR: NEGATIVE

## 2015-12-09 LAB — URINE CULTURE: Culture: NO GROWTH

## 2015-12-09 LAB — LACTIC ACID, PLASMA
LACTIC ACID, VENOUS: 1.8 mmol/L (ref 0.5–2.0)
Lactic Acid, Venous: 1.6 mmol/L (ref 0.5–2.0)

## 2015-12-09 LAB — GLUCOSE, CAPILLARY
GLUCOSE-CAPILLARY: 111 mg/dL — AB (ref 65–99)
GLUCOSE-CAPILLARY: 109 mg/dL — AB (ref 65–99)
Glucose-Capillary: 93 mg/dL (ref 65–99)
Glucose-Capillary: 94 mg/dL (ref 65–99)
Glucose-Capillary: 127 mg/dL — ABNORMAL HIGH (ref 65–99)

## 2015-12-09 LAB — LIPID PANEL
CHOL/HDL RATIO: 2.6 ratio
Cholesterol: 146 mg/dL (ref 0–200)
HDL: 57 mg/dL (ref >40)
LDL CALC: 80 mg/dL (ref 0–99)
Triglycerides: 45 mg/dL (ref <150)
VLDL: 9 mg/dL (ref 0–40)

## 2015-12-09 LAB — BRAIN NATRIURETIC PEPTIDE: B Natriuretic Peptide: 64.6 pg/mL (ref 0.0–100.0)

## 2015-12-09 LAB — OSMOLALITY: OSMOLALITY: 265 mosm/kg — AB (ref 275–295)

## 2015-12-09 LAB — CK: CK TOTAL: 955 U/L — AB (ref 38–234)

## 2015-12-09 LAB — SODIUM, URINE, RANDOM: SODIUM UR: 56 mmol/L

## 2015-12-09 LAB — RPR: RPR Ser Ql: NONREACTIVE

## 2015-12-09 LAB — PROCALCITONIN

## 2015-12-09 LAB — HIV ANTIBODY (ROUTINE TESTING W REFLEX): HIV Screen 4th Generation wRfx: NONREACTIVE

## 2015-12-09 LAB — OSMOLALITY, URINE: OSMOLALITY UR: 209 mosm/kg — AB (ref 300–900)

## 2015-12-09 MED ORDER — SODIUM CHLORIDE 0.9 % IV SOLN
500.0000 mg | Freq: Two times a day (BID) | INTRAVENOUS | Status: DC
  Administered 2015-12-09 – 2015-12-11 (×4): 500 mg via INTRAVENOUS
  Filled 2015-12-09 (×6): qty 5

## 2015-12-09 MED ORDER — CETYLPYRIDINIUM CHLORIDE 0.05 % MT LIQD
7.0000 mL | Freq: Two times a day (BID) | OROMUCOSAL | Status: DC
  Administered 2015-12-09 (×2): 7 mL via OROMUCOSAL

## 2015-12-09 MED ORDER — LIDOCAINE VISCOUS 2 % MT SOLN
15.0000 mL | Freq: Four times a day (QID) | OROMUCOSAL | Status: DC | PRN
  Administered 2015-12-09: 15 mL via OROMUCOSAL
  Filled 2015-12-09 (×3): qty 15

## 2015-12-09 MED ORDER — CHLORHEXIDINE GLUCONATE 0.12 % MT SOLN
15.0000 mL | Freq: Two times a day (BID) | OROMUCOSAL | Status: DC
  Administered 2015-12-09 (×2): 15 mL via OROMUCOSAL
  Filled 2015-12-09 (×2): qty 15

## 2015-12-09 MED ORDER — SODIUM CHLORIDE 0.9 % IV SOLN
INTRAVENOUS | Status: DC
  Administered 2015-12-09 – 2015-12-13 (×12): via INTRAVENOUS

## 2015-12-09 NOTE — Procedures (Signed)
Indication:  This is a 69 yo female with"altered mental status" and possible seizure.  Medications include: ASA, Plavix, ovenox, Insulin, Protonix, Pravachol, and Zoloft.  Technique and Protocol: This is a standard 18-channel VEEG study utilizing international 10/20 system with both bipolar and monopolar montages.  Video source was available.  The study was performed at [facility].   Description of Findings: The VEEG study was started at 10:32 pm on 12/08 and ended at 2:24 pm on 12/09.  Total 16-hour EEG study was scanned in its entirety.  Video source was reviewed.  The baseline EEG background reaches 9-10 Hz and the alpha background is well formed during awake state.  The patient remained in awake state during most of daytime.  Adequate amount of sleep was noted during night with sleep spindles and vertex waves.   No push-button events were captured. There is no focal slowing or asymmetry.  No generalized or focal spikes/sharp waves.  No clinical or subclinical seizures.     Impression: This is a normal 16-hour uneventful VEEG study.  No seizure or subclinical seizures are noted. No epileptiform discharges.

## 2015-12-09 NOTE — Evaluation (Signed)
Clinical/Bedside Swallow Evaluation Patient Details  Name: KEESA RORICK MRN: [id number] Date of Birth: 1946-01-07  Today's Date: 12/09/2015 Time: SLP Start Time (ACUTE ONLY): [id number] SLP Stop Time (ACUTE ONLY): 1141 SLP Time Calculation (min) (ACUTE ONLY): 10 min  Past Medical History:  Past Medical History  Diagnosis Date  . Hypertension   . Stroke [facility]) october 2015  . Anxiety   . Depression    Past Surgical History: History reviewed. No pertinent past surgical history. HPI:  ALBIE COLBECK is a 69 y.o. female with altered mental status of unclear etiology. MRI negative for acute findings. PMH includes CVA, depression, anxiety.   Assessment / Plan / Recommendation Clinical Impression  Pt has pain in her tongue where it was bitten, but otherwise her oropharyngeal swallow appears WFL. There was one delayed throat clear across all intake. Would initiate regular diet and thin liquids. SLP f/u not indicated for swallowing, but will f/u for full cognitive-linguistic assessment as able.    Aspiration Risk  No limitations    Diet Recommendation  Regular diet, thin liquids   Medication Administration: Whole meds with liquid    Other  Recommendations Oral Care Recommendations: Oral care BID   Follow up Recommendations  None (for swallow)    Frequency and Duration            Prognosis        Swallow Study   General Date of Onset: 12/08/15 HPI: AMAZYN SUY is a 69 y.o. female with altered mental status of unclear etiology. MRI negative for acute findings. PMH includes CVA, depression, anxiety. Type of Study: Bedside Swallow Evaluation Previous Swallow Assessment: none in chart Diet Prior to this Study: NPO Temperature Spikes Noted: No Respiratory Status: Room air History of Recent Intubation: No Behavior/Cognition: Alert;Cooperative Oral Cavity Assessment: Other (comment) (lesion on tongue, per chart from episode yesterday) Oral Care Completed by SLP: No Oral Cavity -  Dentition: Adequate natural dentition Vision: Functional for self-feeding Self-Feeding Abilities: Able to feed self Patient Positioning: Upright in bed Baseline Vocal Quality: Normal    Oral/Motor/Sensory Function Overall Oral Motor/Sensory Function: Within functional limits   Ice Chips Ice chips: Not tested   Thin Liquid Thin Liquid: Within functional limits Presentation: Cup;Self Fed;Straw    Nectar Thick Nectar Thick Liquid: Not tested   Honey Thick Honey Thick Liquid: Not tested   Puree Puree: Within functional limits Presentation: Self Fed;Spoon   Solid Solid: Within functional limits Presentation: Self Fed      Germain Osgood, M.A. CCC-SLP [phone]  Germain Osgood 12/09/2015,12:01 PM

## 2015-12-09 NOTE — Progress Notes (Signed)
Subjective: Much improved  Exam: Filed Vitals:   12/09/15 1506 12/09/15 1511  BP: 105/55   Pulse: 82   Temp:  98.9 F (37.2 C)  Resp: 21    Gen: In bed, NAD Resp: non-labored breathing, no acute distress Abd: soft, nt  Neuro: MS: awake, alert, conversant and appropriate.  BD:HDIX, VFF Motor: MAEW Sensory:intact to LT  EEG reveiwed, left temporal sharp waves.   Impression: 69 year old female presenting with what is likely seizure with prolonged postictal phase versus partial status epilepticus that is now resolved. She is much improved at this point. I suspect that the "panic attacks" that she has been having over the past few weeks were actually partial seizures. I'm very reassured by her improvement.  Recommendations: 1) Keppra 500 mg twice a day 2) can discontinue continuous EEG 3) will follow  Roland Rack, MD [facility] [phone]  If 7pm- 7am, please page neurology on call as listed in [place].

## 2015-12-09 NOTE — Progress Notes (Signed)
STAT EEG, changed to STAT LTM  per Dr Janann Colonel.

## 2015-12-09 NOTE — Progress Notes (Addendum)
[facility] PROGRESS NOTE  Samantha Wilkinson BDZ:[id number] DOB: Feb 19, 1946 DOA: 12/08/2015 PCP: No primary care provider on file.  Length of stay: 1   Assessment/Plan: Principal Problem:   Altered mental status Active Problems:   Hyponatremia   Depression with anxiety   Psychogenic polydipsia   Lactic acid acidosis   Encephalopathy   Aphasia    1. Altered mental status.  Suspect seizure, possibly secondary to hyponatremia.Recemtly starte on zoloft. Acute CVA less likely. Infectious process is also considered given leukocytosis and elevated lactic acid.  Negative MRI brain, EEG results pending, received Keppra loading dose; their consultation much appreciated - Witnessed tonic-clonic activity, tongue laceration, and reported to the incontinence in the field favor a diagnosis of seizure. - Ammonia, thyroid studies, an infectious disease workup within normal limits  Continue seizure precautions, DISCUSSED WITH NEUROLOGY , they feels that patients behavioral changes could be possible seizures, continue keppra - Continue the patient's Plavix, increase aspirin to 325, continue statin  lipid panel within normal limits and A1c pending    2. Lactic acidosis, leukocytosis - Lactic acid elevated to a value of 4.59 on admission, has come down to below 2 with administration of IV fluids - White blood cell count elevated to a value of 14,800 with absolute neutrophilia, no bands - Patient has remained afebrile with good O2 saturation on room air, chest x-ray without apparent infiltrate, UA not suggestive of infection  - Cultures are pending, DC antibiotics Received one dose of broad-spectrum coverage with Zosyn and vancomycin, DC antibiotics  3. Hyponatremia due to polydipsia, zoloft and HCTZ ? Presented with a sodium of 121 on admission, now 133, suspect dehydration Start patient on IV fluids Repeat CMP in the morning  4. Acute kidney injury, baseline creatinine around 1,  creatinine 1.55, started the patient on normal saline, check CK  4. Depression with anxiety - This reportedly just began within the last 6 weeks and has been debilitating as described in the history of present illness - May benefit from inpatient psychiatry consultation pending the patient's clinical course  5. Chronic HTN  - Allowing for permissive hypertension until brain MRI is completed and negative for acute ischemic CVA   DVT prophylaxsis   Code Status:      Code Status Orders        Start     Ordered   12/08/15 2157  Full code   Continuous     12/08/15 2206      Family Communication: Discussed in detail with the patient, all imaging results, lab results explained to the patient   Disposition Plan:  As above    Brief narrative: 69 y.o. female with PMH of ischemic stroke with mild residual cognitive difficulties, hypertension, and 6 weeks of disabling depression and anxiety who now presents via EMS with altered mental status, initially not following commands but opening eyes to verbal stimuli. Per EMS report, she had a tongue laceration and urinary incontinence upon their arrival. Her husband and daughter-in-law are at the bedside and provide the history. Samantha Wilkinson suffered an ischemic CVA in October 2015 and his since had difficulty with memory and decision making, but has remained socially engaged and physically active until 10/30/2015. At this time, per report, she began to have bouts of severe anxiety lasting 12-24 hours at a time. These episodes have been coming more frequent, now occurring every 2-3 days. She was started on Zoloft and Ativan in November by her neurologist, Dr. Metta Clines,  but unfortunately had no appreciable improvement. She saw a psychiatrist on December 5 of this year and was prescribed Seroquel when necessary for anxiety. The Seroquel and Ativan have reportedly put her to sleep, but upon wakening, her daughter-in-law describes an unusually high level of  energy with grandiose plans lasting about 24 hours and during which the patient does not sleep or eat. Per the family's report, there is been no fevers, cough, pain complaints, or diarrhea.  In ED, patient was found to be afebrile, saturating well on room air, and with vital signs stable. She has been nonverbal in the emergency department that has followed simple commands. Her family is reportedly witnessed for episodes involving violent jerking of all 4 extremities, lasting for about a minute, and followed by lethargy. She apparently bit her tongue again during one of these episodes. Initial workup has revealed a mild leukocytosis, lactic acid of 4, and a serum sodium of 121. CT of the head was notable for chronic ischemic changes but no acute findings were reported. Neurology was consulted from the emergency department and is currently seeing the patient and left the recommendations. Samantha Wilkinson will be admitted to the stepdown unit for ongoing evaluation and management of altered mental status suspected secondary to CVA, or more likely seizure in the setting of hyponatremia.  Consultants:  Neurology    Procedures:  None  Antibiotics: Anti-infectives    Start     Dose/Rate Route Frequency Ordered Stop   12/09/15 0600  vancomycin (VANCOCIN) IVPB 750 mg/150 ml premix  Status:  Discontinued     750 mg 150 mL/hr over 60 Minutes Intravenous Every 12 hours 12/08/15 1707 12/08/15 2206   12/08/15 1715  vancomycin (VANCOCIN) 1,250 mg in sodium chloride 0.9 % 250 mL IVPB     1,250 mg 166.7 mL/hr over 90 Minutes Intravenous  Once 12/08/15 1704 12/08/15 1910   12/08/15 1700  piperacillin-tazobactam (ZOSYN) IVPB 3.375 g     3.375 g 100 mL/hr over 30 Minutes Intravenous  Once 12/08/15 1655 12/08/15 1816   12/08/15 1700  vancomycin (VANCOCIN) IVPB 1000 mg/200 mL premix  Status:  Discontinued     1,000 mg 200 mL/hr over 60 Minutes Intravenous  Once 12/08/15 1655 12/08/15 1704          HPI/Subjective: Much  more awake and alert  Objective: Filed Vitals:   12/09/15 0530 12/09/15 0700 12/09/15 0800 12/09/15 1100  BP: 137/74  119/69   Pulse: 90  85   Temp:  99.3 F (37.4 C)  99.2 F (37.3 C)  TempSrc:  Oral  Oral  Resp: 22  21   Height:      Weight:      SpO2: 95%  94%     Intake/Output Summary (Last 24 hours) at 12/09/15 1341 Last data filed at 12/09/15 1100  Gross per 24 hour  Intake 283.33 ml  Output   2125 ml  Net -1841.67 ml    Exam: General: Not in acute respiratory distress HEENT:  Eyes: scleral icterus or conjunctival pallor.  ENT: Right lateral tongue laceration No discharge from the ears or nose, no pharyngeal ulcers, petechiae or exudate, no tonsillar enlargement.   Neck: No JVD, no bruit, no appreciable mass Heme: No cervical adenopathy, no pallor Cardiac: S1/S2, RRR, No murmurs, No gallops or rubs. Pulm: Good air movement bilaterally. No rales, wheezing, rhonchi or rubs. Abd: Soft, nondistended, nontender, no rebound pain or gaurding, no mass or organomegaly, BS present. Ext: No LE edema bilaterally. 2+DP/PT  pulse bilaterally. Musculoskeletal: No gross deformity, no red, hot, swollen joints, no limitation in ROM  Skin: No rashes or wounds on exposed surfaces  Neuro: lethargic and not verbally responsive, but following commands. Pupils are equal round reactive to light, tongue protrusion and uvula are midline, brachial and patellar reflexes are 2+ bilaterally, Babinski's sign is negative, and muscle tone is normal  Data Review   Micro Results Recent Results (from the past 240 hour(s))  Urine culture     Status: None (Preliminary result)   Collection Time: 12/08/15  4:20 PM  Result Value Ref Range Status   Specimen Description URINE, CATHETERIZED  Final   Special Requests NONE  Final   Culture NO GROWTH < 24 HOURS  Final   Report Status PENDING  Incomplete  Blood culture (routine x 2)     Status: None  (Preliminary result)   Collection Time: 12/08/15  5:30 PM  Result Value Ref Range Status   Specimen Description BLOOD LEFT ANTECUBITAL  Final   Special Requests BOTTLES DRAWN AEROBIC AND ANAEROBIC 10CC  Final   Culture NO GROWTH < 24 HOURS  Final   Report Status PENDING  Incomplete  Blood culture (routine x 2)     Status: None (Preliminary result)   Collection Time: 12/08/15  5:35 PM  Result Value Ref Range Status   Specimen Description BLOOD LEFT HAND  Final   Special Requests BOTTLES DRAWN AEROBIC ONLY 10CC  Final   Culture NO GROWTH < 24 HOURS  Final   Report Status PENDING  Incomplete  MRSA PCR Screening     Status: None   Collection Time: 12/08/15 11:00 PM  Result Value Ref Range Status   MRSA by PCR NEGATIVE NEGATIVE Final    Comment:        The GeneXpert MRSA Assay (FDA approved for NASAL specimens only), is one component of a comprehensive MRSA colonization surveillance program. It is not intended to diagnose MRSA infection nor to guide or monitor treatment for MRSA infections.     Radiology Reports Ct Head Wo Contrast  12/08/2015  CLINICAL DATA:  Altered mental status EXAM: CT HEAD WITHOUT CONTRAST TECHNIQUE: Contiguous axial images were obtained from the base of the skull through the vertex without intravenous contrast. COMPARISON:  02/04/2015 FINDINGS: The bony calvarium is intact. Prior lacunar infarct is noted on the right in the region of the basal ganglia stable in appearance from the prior study. No findings to suggest acute hemorrhage, acute infarction or space-occupying mass lesion are noted. IMPRESSION: Chronic ischemic change without acute abnormality. Electronically Signed   By: Inez Catalina M.D.   On: 12/08/2015 15:06   Mr Brain Wo Contrast  12/08/2015  CLINICAL DATA:  Altered mental status. Found down. Unable to speak. EXAM: MRI HEAD WITHOUT CONTRAST TECHNIQUE: Multiplanar, multiecho pulse sequences of the brain and surrounding structures were obtained  without intravenous contrast. COMPARISON:  CT head earlier today.  MR head 10/21/2014. FINDINGS: No evidence for acute infarction, hemorrhage, mass lesion, hydrocephalus, or extra-axial fluid. Mild cerebral and cerebellar atrophy. Minor white matter disease. Remote RIGHT MCA territory infarction affecting the lentiform nucleus and periventricular white matter, with significant encephalomalacia and gliosis. Smaller remote LEFT occipital infarct, also without hemorrhage. No features suggestive of carbon monoxide poisoning. Flow voids are maintained in the carotid, basilar, and both vertebral arteries. No midline abnormality of significance. Extracranial soft tissues appear unremarkable. Good general agreement with recent CT. Compared with prior MR in 2015, LEFT occipital lobe infarct was acute.  IMPRESSION: Evidence for chronic cerebral ischemia. No acute intracranial findings. Electronically Signed   By: Staci Righter M.D.   On: 12/08/2015 20:14   Dg Chest Portable 1 View  12/08/2015  CLINICAL DATA:  69 year old female with altered mental status. Hypertension. Initial encounter. EXAM: PORTABLE CHEST 1 VIEW COMPARISON:  07/10/2015. FINDINGS: Mild central pulmonary vascular prominence. No segmental consolidation or pneumothorax. Heart size top-normal. Limited evaluation mediastinal structures without gross abnormality. Mild acromioclavicular joint degenerative changes. IMPRESSION: Mild central pulmonary vascular prominence. No infiltrate or pneumothorax. Electronically Signed   By: Genia Del M.D.   On: 12/08/2015 15:36     CBC  Recent Labs Lab 12/08/15 1540 12/08/15 1614 12/08/15 2330 12/09/15 0409  WBC 14.8*  --  16.6* 15.2*  HGB 13.2 15.0 12.3 11.0*  HCT 37.1 44.0 33.7* 31.3*  PLT 366  --  337 313  MCV 81.5  --  81.6 82.6  MCH 29.0  --  29.8 29.0  MCHC 35.6  --  36.5* 35.1  RDW 12.0  --  12.2 12.4  LYMPHSABS 1.3  --   --  1.2  MONOABS 0.8  --   --  1.1*  EOSABS 0.0  --   --  0.0  BASOSABS  0.0  --   --  0.0    Chemistries   Recent Labs Lab 12/08/15 1540 12/08/15 1614 12/08/15 2330 12/09/15 0409 12/09/15 0953  NA 121* 123* 129* 130* 133*  K 3.8 3.9 4.4 4.5 4.4  CL 88* 88* 99* 103 104  CO2 20*  --  20* 19* 21*  GLUCOSE 201* 203* 110* 93 103*  BUN 13 16 15 15 14   CREATININE 0.80 0.60 1.17* 1.37* 1.55*  CALCIUM 9.5  --  8.9 8.9 9.2  AST 42*  --   --   --   --   ALT 57*  --   --   --   --   ALKPHOS 79  --   --   --   --   BILITOT 0.5  --   --   --   --    ------------------------------------------------------------------------------------------------------------------ estimated creatinine clearance is 32.4 mL/min (by C-G formula based on Cr of 1.55). ------------------------------------------------------------------------------------------------------------------ No results for input(s): HGBA1C in the last 72 hours. ------------------------------------------------------------------------------------------------------------------  Recent Labs  12/09/15 0409  CHOL 146  HDL 57  LDLCALC 80  TRIG 45  CHOLHDL 2.6   ------------------------------------------------------------------------------------------------------------------  Recent Labs  12/08/15 1540  TSH 2.823   ------------------------------------------------------------------------------------------------------------------  Recent Labs  12/08/15 2330  VITAMINB12 701    Coagulation profile No results for input(s): INR, PROTIME in the last 168 hours.  No results for input(s): DDIMER in the last 72 hours.  Cardiac Enzymes No results for input(s): CKMB, TROPONINI, MYOGLOBIN in the last 168 hours.  Invalid input(s): CK ------------------------------------------------------------------------------------------------------------------ Invalid input(s): POCBNP   CBG:  Recent Labs Lab 12/08/15 1647 12/09/15 0657 12/09/15 0809  GLUCAP 169* 93 94       Studies: Ct Head Wo  Contrast  12/08/2015  CLINICAL DATA:  Altered mental status EXAM: CT HEAD WITHOUT CONTRAST TECHNIQUE: Contiguous axial images were obtained from the base of the skull through the vertex without intravenous contrast. COMPARISON:  02/04/2015 FINDINGS: The bony calvarium is intact. Prior lacunar infarct is noted on the right in the region of the basal ganglia stable in appearance from the prior study. No findings to suggest acute hemorrhage, acute infarction or space-occupying mass lesion are noted. IMPRESSION: Chronic ischemic change without acute abnormality. Electronically  Signed   By: Inez Catalina M.D.   On: 12/08/2015 15:06   Mr Brain Wo Contrast  12/08/2015  CLINICAL DATA:  Altered mental status. Found down. Unable to speak. EXAM: MRI HEAD WITHOUT CONTRAST TECHNIQUE: Multiplanar, multiecho pulse sequences of the brain and surrounding structures were obtained without intravenous contrast. COMPARISON:  CT head earlier today.  MR head 10/21/2014. FINDINGS: No evidence for acute infarction, hemorrhage, mass lesion, hydrocephalus, or extra-axial fluid. Mild cerebral and cerebellar atrophy. Minor white matter disease. Remote RIGHT MCA territory infarction affecting the lentiform nucleus and periventricular white matter, with significant encephalomalacia and gliosis. Smaller remote LEFT occipital infarct, also without hemorrhage. No features suggestive of carbon monoxide poisoning. Flow voids are maintained in the carotid, basilar, and both vertebral arteries. No midline abnormality of significance. Extracranial soft tissues appear unremarkable. Good general agreement with recent CT. Compared with prior MR in 2015, LEFT occipital lobe infarct was acute. IMPRESSION: Evidence for chronic cerebral ischemia. No acute intracranial findings. Electronically Signed   By: Staci Righter M.D.   On: 12/08/2015 20:14   Dg Chest Portable 1 View  12/08/2015  CLINICAL DATA:  69 year old female with altered mental status.  Hypertension. Initial encounter. EXAM: PORTABLE CHEST 1 VIEW COMPARISON:  07/10/2015. FINDINGS: Mild central pulmonary vascular prominence. No segmental consolidation or pneumothorax. Heart size top-normal. Limited evaluation mediastinal structures without gross abnormality. Mild acromioclavicular joint degenerative changes. IMPRESSION: Mild central pulmonary vascular prominence. No infiltrate or pneumothorax. Electronically Signed   By: Genia Del M.D.   On: 12/08/2015 15:36      No results found for: HGBA1C Lab Results  Component Value Date   LDLCALC 80 12/09/2015   CREATININE 1.55* 12/09/2015       Scheduled Meds: . antiseptic oral rinse  7 mL Mouth Rinse q12n4p  . aspirin  325 mg Oral Daily  . chlorhexidine  15 mL Mouth Rinse BID  . clopidogrel  75 mg Oral Daily  . enoxaparin (LOVENOX) injection  40 mg Subcutaneous Q24H  . insulin aspart  0-9 Units Subcutaneous TID WC  . pantoprazole  40 mg Oral Daily  . pravastatin  40 mg Oral q1800  . sertraline  50 mg Oral Daily   Continuous Infusions: . sodium chloride      Principal Problem:   Altered mental status Active Problems:   Hyponatremia   Depression with anxiety   Psychogenic polydipsia   Lactic acid acidosis   Encephalopathy   Aphasia    Time spent: 45 minutes   [facility] Pager [phone]. If 7PM-7AM, please contact night-coverage at [email or web address], password [email or web address] 12/09/2015, 1:41 PM  LOS: 1 day

## 2015-12-09 NOTE — Progress Notes (Signed)
UR COMPLETED  

## 2015-12-09 NOTE — Progress Notes (Signed)
LTM EEG complete. No skin breakdown

## 2015-12-09 NOTE — Evaluation (Signed)
Physical Therapy Evaluation Patient Details Name: Samantha Wilkinson MRN: [id number] DOB: 1946-09-18 Today's Date: 12/09/2015   History of Present Illness  pt is a 69 y/o female with h/o stroke, depression and HTN, admitted with AMS and episodes of violent jerking of extremities now suspected as seizure likely due to hyponatremia.  Work up continues.  Clinical Impression  Pt admitted with/for AMS, ?seizure.  Pt currently limited functionally due to the problems listed below.  (see problems list.)  Pt will benefit from PT to maximize function and safety to be able to get home safely with available assist of family.     Follow Up Recommendations Home health PT;Supervision/Assistance - 24 hour    Equipment Recommendations       Recommendations for Other Services       Precautions / Restrictions Precautions Precautions: Fall      Mobility  Bed Mobility Overal bed mobility: Needs Assistance Bed Mobility: Supine to Sit;Sit to Sidelying     Supine to sit: Min guard   Sit to sidelying: Min guard    Transfers Overall transfer level: Needs assistance   Transfers: Sit to/from [employer] Transfers Sit to Stand: Min guard Stand pivot transfers: Min assist          Ambulation/Gait             General Gait Details: not tested due to getting EEG  Stairs            Wheelchair Mobility    Modified Rankin (Stroke Patients Only)       Balance                                             Pertinent Vitals/Pain Pain Assessment: Faces Faces Pain Scale: Hurts little more Pain Location: catheter pulling Pain Descriptors / Indicators: Discomfort Pain Intervention(s): Monitored during session    Home Living Family/patient expects to be discharged to:: Private residence Living Arrangements: Spouse/significant other Available Help at Discharge: Family;Available 24 hours/day Type of Home: House Home Access: Stairs to enter Entrance  Stairs-Rails: Psychiatric nurse of Steps: several Home Layout: One level Home Equipment: Other (comment) (TBA)      Prior Function Level of Independence: Independent         Comments: driving, running errands     Hand Dominance        Extremity/Trunk Assessment   Upper Extremity Assessment: Generalized weakness;Overall [facility] for tasks assessed (triceps weaker than biceps bil)           Lower Extremity Assessment: Overall WFL for tasks assessed (proximal weakness, trunk and hip flexors)         Communication   Communication:  (soft spoken)  Cognition Arousal/Alertness: Awake/alert Behavior During Therapy: WFL for tasks assessed/performed Overall Cognitive Status: Impaired/Different from baseline                      General Comments      Exercises        Assessment/Plan    PT Assessment Patient needs continued PT services  PT Diagnosis Generalized weakness;Altered mental status   PT Problem List Decreased strength;Decreased activity tolerance;Decreased balance;Decreased coordination  PT Treatment Interventions Gait training;Stair training;Functional mobility training;Therapeutic activities;Patient/family education   PT Goals (Current goals can be found in the Care Plan section) Acute Rehab PT Goals Patient Stated Goal: pt did not particiate  in goal setting today. PT Goal Formulation: Patient unable to participate in goal setting Time For Goal Achievement: 12/23/15 Potential to Achieve Goals: Good    Frequency Min 3X/week   Barriers to discharge        Co-evaluation               End of Session   Activity Tolerance: Patient tolerated treatment well Patient left: in bed;with call bell/phone within reach;with family/visitor present Nurse Communication: Mobility status         Time: [id number] PT Time Calculation (min) (ACUTE ONLY): 20 min   Charges:   PT Evaluation $Initial PT Evaluation Tier I: 1 Procedure      PT G Codes:        [name], Tessie Fass 12/09/2015, 1:11 PM 12/09/2015  Donnella Sham, PT [phone]  (pager)

## 2015-12-10 DIAGNOSIS — R400 Somnolence: Secondary | ICD-10-CM

## 2015-12-10 LAB — PROCALCITONIN: PROCALCITONIN: 0.12 ng/mL

## 2015-12-10 LAB — CBC WITH DIFFERENTIAL/PLATELET
BASOS ABS: 0 10*3/uL (ref 0.0–0.1)
BASOS PCT: 0 %
EOS ABS: 0.1 10*3/uL (ref 0.0–0.7)
EOS PCT: 1 %
HCT: 29.7 % — ABNORMAL LOW (ref 36.0–46.0)
Hemoglobin: 10.2 g/dL — ABNORMAL LOW (ref 12.0–15.0)
Lymphocytes Relative: 22 %
Lymphs Abs: 2.2 10*3/uL (ref 0.7–4.0)
MCH: 29.3 pg (ref 26.0–34.0)
MCHC: 34.3 g/dL (ref 30.0–36.0)
MCV: 85.3 fL (ref 78.0–100.0)
MONO ABS: 0.8 10*3/uL (ref 0.1–1.0)
Monocytes Relative: 8 %
Neutro Abs: 6.7 10*3/uL (ref 1.7–7.7)
Neutrophils Relative %: 69 %
PLATELETS: 287 10*3/uL (ref 150–400)
RBC: 3.48 MIL/uL — ABNORMAL LOW (ref 3.87–5.11)
RDW: 12.9 % (ref 11.5–15.5)
WBC: 9.7 10*3/uL (ref 4.0–10.5)

## 2015-12-10 LAB — COMPREHENSIVE METABOLIC PANEL
ALBUMIN: 3 g/dL — AB (ref 3.5–5.0)
ALT: 35 U/L (ref 14–54)
AST: 44 U/L — AB (ref 15–41)
Alkaline Phosphatase: 50 U/L (ref 38–126)
Anion gap: 6 (ref 5–15)
BUN: 15 mg/dL (ref 6–20)
CHLORIDE: 107 mmol/L (ref 101–111)
CO2: 23 mmol/L (ref 22–32)
Calcium: 8.7 mg/dL — ABNORMAL LOW (ref 8.9–10.3)
Creatinine, Ser: 1.63 mg/dL — ABNORMAL HIGH (ref 0.44–1.00)
GFR calc Af Amer: 36 mL/min — ABNORMAL LOW (ref >60)
GFR, EST NON AFRICAN AMERICAN: 31 mL/min — AB (ref >60)
Glucose, Bld: 94 mg/dL (ref 65–99)
POTASSIUM: 4.2 mmol/L (ref 3.5–5.1)
SODIUM: 136 mmol/L (ref 135–145)
Total Bilirubin: 0.6 mg/dL (ref 0.3–1.2)
Total Protein: 5.8 g/dL — ABNORMAL LOW (ref 6.5–8.1)

## 2015-12-10 LAB — GLUCOSE, CAPILLARY
GLUCOSE-CAPILLARY: 155 mg/dL — AB (ref 65–99)
Glucose-Capillary: 83 mg/dL (ref 65–99)
Glucose-Capillary: 87 mg/dL (ref 65–99)

## 2015-12-10 LAB — CK: CK TOTAL: 2108 U/L — AB (ref 38–234)

## 2015-12-10 LAB — HEMOGLOBIN A1C
Hgb A1c MFr Bld: 6.1 % — ABNORMAL HIGH (ref 4.8–5.6)
MEAN PLASMA GLUCOSE: 128 mg/dL

## 2015-12-10 MED ORDER — ASPIRIN 81 MG PO CHEW
81.0000 mg | CHEWABLE_TABLET | Freq: Every day | ORAL | Status: DC
  Administered 2015-12-11 – 2015-12-13 (×3): 81 mg via ORAL
  Filled 2015-12-10 (×3): qty 1

## 2015-12-10 MED ORDER — WHITE PETROLATUM GEL
Status: AC
  Administered 2015-12-10: 12:00:00
  Filled 2015-12-10: qty 1

## 2015-12-10 NOTE — Progress Notes (Signed)
Patient transferred to 2W09 via wheelchair with NT, husband at bedside and belongings with patient.

## 2015-12-10 NOTE — Progress Notes (Signed)
[facility] PROGRESS NOTE  Samantha Wilkinson DTO:[id number] DOB: 16-Mar-1946 DOA: 12/08/2015 PCP: No primary care provider on file.  Length of stay: 2   Assessment/Plan: Principal Problem:   Altered mental status Active Problems:   Hyponatremia   Depression with anxiety   Psychogenic polydipsia   Lactic acid acidosis   Encephalopathy   Aphasia   Convulsions ([place])    1. Altered mental status.  Suspect seizure,with prolonged postictal phase versus partial status epilepticus that is now resolved possibly secondary to hyponatremia.Recemtly starte on zoloft. Acute CVA less likely. Infectious process is also considered given leukocytosis and elevated lactic acid.  Negative MRI brain, EEG results eft temporal sharps could be part of a sharply contoured underlying rhythm which would not be epileptiform,  received Keppra loading dose; recommended to continue Keppra 500 mg BID -  unable to drive, operate heavy machinery, perform activities at heights or participate in water activities until release by outpatient physician. This was discussed with the patient who expressed understanding - Ammonia, thyroid studies, an infectious disease workup within normal limits  Continue seizure precautions, DISCUSSED WITH NEUROLOGY, will need outpatient follow-up - Continue the patient's Plavix, /aspirin, continue statin  lipid panel within normal limits and A1c 6.1, repeat in 3 months    2. Lactic acidosis, leukocytosis - Lactic acid elevated to a value of 4.59 on admission, has come down to below 2 with administration of IV fluids, leukocytosis resolved - Patient has remained afebrile with good O2 saturation on room air, chest x-ray without apparent infiltrate, UA not suggestive of infection  - Cultures no growth so far, DC antibiotics Received one dose of broad-spectrum coverage with Zosyn and vancomycin,    3. Hyponatremia due to polydipsia, zoloft and HCTZ ? Presented with a sodium of  121 on admission, now 136, suspect dehydration Start patient on IV fluids Repeat CMP in the morning  4. Acute kidney injury, baseline creatinine around 1, creatinine 1.55, started the patient on normal saline, renal failure secondary to rhabdomyolysis, continue IV fluids and check CK tomorrow  4. Depression with anxiety - This reportedly just began within the last 6 weeks and has been debilitating as described in the history of present illness - May benefit from inpatient psychiatry consultation pending the patient's clinical course  5. Chronic HTN  - Allowing for permissive hypertension until brain MRI is completed and negative for acute ischemic CVA   DVT prophylaxsis Lovenox  Code Status:      Code Status Orders        Start     Ordered   12/08/15 2157  Full code   Continuous     12/08/15 2206      Family Communication: Discussed in detail with the patient, all imaging results, lab results explained to the patient   Disposition Plan:  Pending resolution of renal failure    Brief narrative: 69 y.o. female with PMH of ischemic stroke with mild residual cognitive difficulties, hypertension, and 6 weeks of disabling depression and anxiety who now presents via EMS with altered mental status, initially not following commands but opening eyes to verbal stimuli. Per EMS report, she had a tongue laceration and urinary incontinence upon their arrival. Her husband and daughter-in-law are at the bedside and provide the history. Samantha Wilkinson suffered an ischemic CVA in October 2015 and his since had difficulty with memory and decision making, but has remained socially engaged and physically active until 10/30/2015. At this time, per report,  she began to have bouts of severe anxiety lasting 12-24 hours at a time. These episodes have been coming more frequent, now occurring every 2-3 days. She was started on Zoloft and Ativan in November by her neurologist, Dr. Metta Clines, but unfortunately had  no appreciable improvement. She saw a psychiatrist on December 5 of this year and was prescribed Seroquel when necessary for anxiety. The Seroquel and Ativan have reportedly put her to sleep, but upon wakening, her daughter-in-law describes an unusually high level of energy with grandiose plans lasting about 24 hours and during which the patient does not sleep or eat. Per the family's report, there is been no fevers, cough, pain complaints, or diarrhea.  In ED, patient was found to be afebrile, saturating well on room air, and with vital signs stable. She has been nonverbal in the emergency department that has followed simple commands. Her family is reportedly witnessed for episodes involving violent jerking of all 4 extremities, lasting for about a minute, and followed by lethargy. She apparently bit her tongue again during one of these episodes. Initial workup has revealed a mild leukocytosis, lactic acid of 4, and a serum sodium of 121. CT of the head was notable for chronic ischemic changes but no acute findings were reported. Neurology was consulted from the emergency department and is currently seeing the patient and left the recommendations. Ms. Laster will be admitted to the stepdown unit for ongoing evaluation and management of altered mental status suspected secondary to CVA, or more likely seizure in the setting of hyponatremia.  Consultants:  Neurology    Procedures:  None  Antibiotics: Anti-infectives    Start     Dose/Rate Route Frequency Ordered Stop   12/09/15 0600  vancomycin (VANCOCIN) IVPB 750 mg/150 ml premix  Status:  Discontinued     750 mg 150 mL/hr over 60 Minutes Intravenous Every 12 hours 12/08/15 1707 12/08/15 2206   12/08/15 1715  vancomycin (VANCOCIN) 1,250 mg in sodium chloride 0.9 % 250 mL IVPB     1,250 mg 166.7 mL/hr over 90 Minutes Intravenous  Once 12/08/15 1704 12/08/15 1910   12/08/15 1700  piperacillin-tazobactam (ZOSYN) IVPB 3.375 g     3.375 g 100 mL/hr  over 30 Minutes Intravenous  Once 12/08/15 1655 12/08/15 1816   12/08/15 1700  vancomycin (VANCOCIN) IVPB 1000 mg/200 mL premix  Status:  Discontinued     1,000 mg 200 mL/hr over 60 Minutes Intravenous  Once 12/08/15 1655 12/08/15 1704         Subjective: More alert and awake  Objective: Filed Vitals:   12/10/15 0004 12/10/15 0343 12/10/15 0720 12/10/15 0759  BP: 118/59 113/58 121/77   Pulse: 80 80 79   Temp: 98.1 F (36.7 C) 98.2 F (36.8 C)  98.6 F (37 C)  TempSrc: Oral Oral  Oral  Resp: 18 23 24    Height:      Weight:      SpO2: 95% 95% 96%     Intake/Output Summary (Last 24 hours) at 12/10/15 1150 Last data filed at 12/10/15 0900  Gross per 24 hour  Intake 746.67 ml  Output   1800 ml  Net -1053.33 ml    Exam: General: Not in acute respiratory distress HEENT:  Eyes: scleral icterus or conjunctival pallor.  ENT: Right lateral tongue laceration No discharge from the ears or nose, no pharyngeal ulcers, petechiae or exudate, no tonsillar enlargement.   Neck: No JVD, no bruit, no appreciable mass Heme: No cervical adenopathy, no pallor  Cardiac: S1/S2, RRR, No murmurs, No gallops or rubs. Pulm: Good air movement bilaterally. No rales, wheezing, rhonchi or rubs. Abd: Soft, nondistended, nontender, no rebound pain or gaurding, no mass or organomegaly, BS present. Ext: No LE edema bilaterally. 2+DP/PT pulse bilaterally. Musculoskeletal: No gross deformity, no red, hot, swollen joints, no limitation in ROM  Skin: No rashes or wounds on exposed surfaces  Neuro: lethargic and not verbally responsive, but following commands. Pupils are equal round reactive to light, tongue protrusion and uvula are midline, brachial and patellar reflexes are 2+ bilaterally, Babinski's sign is negative, and muscle tone is normal  Data Review   Micro Results Recent Results (from the past 240 hour(s))  Urine culture     Status: None   Collection Time: 12/08/15  4:20 PM   Result Value Ref Range Status   Specimen Description URINE, CATHETERIZED  Final   Special Requests NONE  Final   Culture NO GROWTH 1 DAY  Final   Report Status 12/09/2015 FINAL  Final  Blood culture (routine x 2)     Status: None (Preliminary result)   Collection Time: 12/08/15  5:30 PM  Result Value Ref Range Status   Specimen Description BLOOD LEFT ANTECUBITAL  Final   Special Requests BOTTLES DRAWN AEROBIC AND ANAEROBIC 10CC  Final   Culture NO GROWTH < 24 HOURS  Final   Report Status PENDING  Incomplete  Blood culture (routine x 2)     Status: None (Preliminary result)   Collection Time: 12/08/15  5:35 PM  Result Value Ref Range Status   Specimen Description BLOOD LEFT HAND  Final   Special Requests BOTTLES DRAWN AEROBIC ONLY 10CC  Final   Culture NO GROWTH < 24 HOURS  Final   Report Status PENDING  Incomplete  MRSA PCR Screening     Status: None   Collection Time: 12/08/15 11:00 PM  Result Value Ref Range Status   MRSA by PCR NEGATIVE NEGATIVE Final    Comment:        The GeneXpert MRSA Assay (FDA approved for NASAL specimens only), is one component of a comprehensive MRSA colonization surveillance program. It is not intended to diagnose MRSA infection nor to guide or monitor treatment for MRSA infections.     Radiology Reports Ct Head Wo Contrast  12/08/2015  CLINICAL DATA:  Altered mental status EXAM: CT HEAD WITHOUT CONTRAST TECHNIQUE: Contiguous axial images were obtained from the base of the skull through the vertex without intravenous contrast. COMPARISON:  02/04/2015 FINDINGS: The bony calvarium is intact. Prior lacunar infarct is noted on the right in the region of the basal ganglia stable in appearance from the prior study. No findings to suggest acute hemorrhage, acute infarction or space-occupying mass lesion are noted. IMPRESSION: Chronic ischemic change without acute abnormality. Electronically Signed   By: Inez Catalina M.D.   On: 12/08/2015 15:06   Mr  Brain Wo Contrast  12/08/2015  CLINICAL DATA:  Altered mental status. Found down. Unable to speak. EXAM: MRI HEAD WITHOUT CONTRAST TECHNIQUE: Multiplanar, multiecho pulse sequences of the brain and surrounding structures were obtained without intravenous contrast. COMPARISON:  CT head earlier today.  MR head 10/21/2014. FINDINGS: No evidence for acute infarction, hemorrhage, mass lesion, hydrocephalus, or extra-axial fluid. Mild cerebral and cerebellar atrophy. Minor white matter disease. Remote RIGHT MCA territory infarction affecting the lentiform nucleus and periventricular white matter, with significant encephalomalacia and gliosis. Smaller remote LEFT occipital infarct, also without hemorrhage. No features suggestive of carbon monoxide poisoning. Flow  voids are maintained in the carotid, basilar, and both vertebral arteries. No midline abnormality of significance. Extracranial soft tissues appear unremarkable. Good general agreement with recent CT. Compared with prior MR in 2015, LEFT occipital lobe infarct was acute. IMPRESSION: Evidence for chronic cerebral ischemia. No acute intracranial findings. Electronically Signed   By: Staci Righter M.D.   On: 12/08/2015 20:14   Dg Chest Port 1 View  12/09/2015  CLINICAL DATA:  Shortness of breath.  Possible seizure EXAM: PORTABLE CHEST 1 VIEW COMPARISON:  Yesterday FINDINGS: Stable borderline cardiomegaly. Negative aortic and hilar contours. Normalized appearance of the interstitium. There is no edema, consolidation, effusion, or pneumothorax. IMPRESSION: No active disease. Electronically Signed   By: Monte Fantasia M.D.   On: 12/09/2015 14:35   Dg Chest Portable 1 View  12/08/2015  CLINICAL DATA:  69 year old female with altered mental status. Hypertension. Initial encounter. EXAM: PORTABLE CHEST 1 VIEW COMPARISON:  07/10/2015. FINDINGS: Mild central pulmonary vascular prominence. No segmental consolidation or pneumothorax. Heart size top-normal. Limited  evaluation mediastinal structures without gross abnormality. Mild acromioclavicular joint degenerative changes. IMPRESSION: Mild central pulmonary vascular prominence. No infiltrate or pneumothorax. Electronically Signed   By: Genia Del M.D.   On: 12/08/2015 15:36     CBC  Recent Labs Lab 12/08/15 1540 12/08/15 1614 12/08/15 2330 12/09/15 0409 12/10/15 0422  WBC 14.8*  --  16.6* 15.2* 9.7  HGB 13.2 15.0 12.3 11.0* 10.2*  HCT 37.1 44.0 33.7* 31.3* 29.7*  PLT 366  --  337 313 287  MCV 81.5  --  81.6 82.6 85.3  MCH 29.0  --  29.8 29.0 29.3  MCHC 35.6  --  36.5* 35.1 34.3  RDW 12.0  --  12.2 12.4 12.9  LYMPHSABS 1.3  --   --  1.2 2.2  MONOABS 0.8  --   --  1.1* 0.8  EOSABS 0.0  --   --  0.0 0.1  BASOSABS 0.0  --   --  0.0 0.0    Chemistries   Recent Labs Lab 12/08/15 1540 12/08/15 1614 12/08/15 2330 12/09/15 0409 12/09/15 0953 12/10/15 0422  NA 121* 123* 129* 130* 133* 136  K 3.8 3.9 4.4 4.5 4.4 4.2  CL 88* 88* 99* 103 104 107  CO2 20*  --  20* 19* 21* 23  GLUCOSE 201* 203* 110* 93 103* 94  BUN 13 16 15 15 14 15   CREATININE 0.80 0.60 1.17* 1.37* 1.55* 1.63*  CALCIUM 9.5  --  8.9 8.9 9.2 8.7*  AST 42*  --   --   --   --  44*  ALT 57*  --   --   --   --  35  ALKPHOS 79  --   --   --   --  50  BILITOT 0.5  --   --   --   --  0.6   ------------------------------------------------------------------------------------------------------------------ estimated creatinine clearance is 30.8 mL/min (by C-G formula based on Cr of 1.63). ------------------------------------------------------------------------------------------------------------------  Recent Labs  12/09/15 0429  HGBA1C 6.1*   ------------------------------------------------------------------------------------------------------------------  Recent Labs  12/09/15 0409  CHOL 146  HDL 57  LDLCALC 80  TRIG 45  CHOLHDL 2.6    ------------------------------------------------------------------------------------------------------------------  Recent Labs  12/08/15 1540  TSH 2.823   ------------------------------------------------------------------------------------------------------------------  Recent Labs  12/08/15 2330  VITAMINB12 701    Coagulation profile No results for input(s): INR, PROTIME in the last 168 hours.  No results for input(s): DDIMER in the last 72 hours.  Cardiac  Enzymes No results for input(s): CKMB, TROPONINI, MYOGLOBIN in the last 168 hours.  Invalid input(s): CK ------------------------------------------------------------------------------------------------------------------ Invalid input(s): POCBNP   CBG:  Recent Labs Lab 12/09/15 0809 12/09/15 1230 12/09/15 1616 12/09/15 2143 12/10/15 0756  GLUCAP 94 111* 127* 109* 83       Studies: Ct Head Wo Contrast  12/08/2015  CLINICAL DATA:  Altered mental status EXAM: CT HEAD WITHOUT CONTRAST TECHNIQUE: Contiguous axial images were obtained from the base of the skull through the vertex without intravenous contrast. COMPARISON:  02/04/2015 FINDINGS: The bony calvarium is intact. Prior lacunar infarct is noted on the right in the region of the basal ganglia stable in appearance from the prior study. No findings to suggest acute hemorrhage, acute infarction or space-occupying mass lesion are noted. IMPRESSION: Chronic ischemic change without acute abnormality. Electronically Signed   By: Inez Catalina M.D.   On: 12/08/2015 15:06   Mr Brain Wo Contrast  12/08/2015  CLINICAL DATA:  Altered mental status. Found down. Unable to speak. EXAM: MRI HEAD WITHOUT CONTRAST TECHNIQUE: Multiplanar, multiecho pulse sequences of the brain and surrounding structures were obtained without intravenous contrast. COMPARISON:  CT head earlier today.  MR head 10/21/2014. FINDINGS: No evidence for acute infarction, hemorrhage, mass lesion,  hydrocephalus, or extra-axial fluid. Mild cerebral and cerebellar atrophy. Minor white matter disease. Remote RIGHT MCA territory infarction affecting the lentiform nucleus and periventricular white matter, with significant encephalomalacia and gliosis. Smaller remote LEFT occipital infarct, also without hemorrhage. No features suggestive of carbon monoxide poisoning. Flow voids are maintained in the carotid, basilar, and both vertebral arteries. No midline abnormality of significance. Extracranial soft tissues appear unremarkable. Good general agreement with recent CT. Compared with prior MR in 2015, LEFT occipital lobe infarct was acute. IMPRESSION: Evidence for chronic cerebral ischemia. No acute intracranial findings. Electronically Signed   By: Staci Righter M.D.   On: 12/08/2015 20:14   Dg Chest Port 1 View  12/09/2015  CLINICAL DATA:  Shortness of breath.  Possible seizure EXAM: PORTABLE CHEST 1 VIEW COMPARISON:  Yesterday FINDINGS: Stable borderline cardiomegaly. Negative aortic and hilar contours. Normalized appearance of the interstitium. There is no edema, consolidation, effusion, or pneumothorax. IMPRESSION: No active disease. Electronically Signed   By: Monte Fantasia M.D.   On: 12/09/2015 14:35   Dg Chest Portable 1 View  12/08/2015  CLINICAL DATA:  69 year old female with altered mental status. Hypertension. Initial encounter. EXAM: PORTABLE CHEST 1 VIEW COMPARISON:  07/10/2015. FINDINGS: Mild central pulmonary vascular prominence. No segmental consolidation or pneumothorax. Heart size top-normal. Limited evaluation mediastinal structures without gross abnormality. Mild acromioclavicular joint degenerative changes. IMPRESSION: Mild central pulmonary vascular prominence. No infiltrate or pneumothorax. Electronically Signed   By: Genia Del M.D.   On: 12/08/2015 15:36      Lab Results  Component Value Date   HGBA1C 6.1* 12/09/2015   Lab Results  Component Value Date   LDLCALC 80  12/09/2015   CREATININE 1.63* 12/10/2015       Scheduled Meds: . aspirin  325 mg Oral Daily  . clopidogrel  75 mg Oral Daily  . enoxaparin (LOVENOX) injection  40 mg Subcutaneous Q24H  . insulin aspart  0-9 Units Subcutaneous TID WC  . levETIRAcetam  500 mg Intravenous Q12H  . pantoprazole  40 mg Oral Daily  . pravastatin  40 mg Oral q1800  . sertraline  50 mg Oral Daily   Continuous Infusions: . sodium chloride 125 mL/hr at 12/10/15 1047    Principal Problem:  Altered mental status Active Problems:   Hyponatremia   Depression with anxiety   Psychogenic polydipsia   Lactic acid acidosis   Encephalopathy   Aphasia   Convulsions ([place])    Time spent: 45 minutes   [facility] Pager [phone]. If 7PM-7AM, please contact night-coverage at [email or web address], password [facility] 12/10/2015, 11:50 AM  LOS: 2 days

## 2015-12-10 NOTE — Progress Notes (Signed)
Pt received to unit.  Telemetry applied and CCMD notified.  Room education given including telephone and call light.  Pt receiving NS @ 125 ml.  Pt stable with no c/o.  Questions answered.  Will cont to monitor.

## 2015-12-10 NOTE — Progress Notes (Addendum)
Subjective: Markedly improved.   Exam: Filed Vitals:   12/10/15 0720 12/10/15 0759  BP: 121/77   Pulse: 79   Temp:  98.6 F (37 C)  Resp: 24    Gen: In bed, NAD Resp: non-labored breathing, no acute distress Abd: soft, nt  Neuro: MS: awake, alert, conversant and appropriate.  RK:YHCW, VFF Motor: MAEW Sensory:intact to LT  EEG: on further review, what I suspected were left temporal sharps could be part of a sharply contoured underlying rhythm which would not be epileptiform, however, I am not certain and still think that these might be epileptiform discharges.   Impression: 70 year old female presenting with what is likely seizure with prolonged postictal phase versus partial status epilepticus that is now resolved. She is much improved at this point. I suspect that the "panic attacks" that she has been having over the past few weeks were actually partial seizures. I'm very reassured by her improvement.  I am not sure that her sodium level alone was enough to cause this, thoguh certainly may have contributed. I would favor continued AED therapy.   Recommendations: 1) Keppra 500 mg twice a day 2) Patient is unable to drive, operate heavy machinery, perform activities at heights or participate in water activities until release by outpatient physician. This was discussed with the patient who expressed understanding.  3) I have requested neurology follow up.  4) please call with any further questions or concerns.   Roland Rack, MD [facility] [phone]  If 7pm- 7am, please page neurology on call as listed in [place].

## 2015-12-11 LAB — CBC WITH DIFFERENTIAL/PLATELET
BASOS ABS: 0 10*3/uL (ref 0.0–0.1)
BASOS PCT: 1 %
Eosinophils Absolute: 0.2 10*3/uL (ref 0.0–0.7)
Eosinophils Relative: 3 %
HEMATOCRIT: 28.2 % — AB (ref 36.0–46.0)
HEMOGLOBIN: 9.6 g/dL — AB (ref 12.0–15.0)
LYMPHS PCT: 27 %
Lymphs Abs: 2.2 10*3/uL (ref 0.7–4.0)
MCH: 29.1 pg (ref 26.0–34.0)
MCHC: 34 g/dL (ref 30.0–36.0)
MCV: 85.5 fL (ref 78.0–100.0)
MONO ABS: 0.6 10*3/uL (ref 0.1–1.0)
Monocytes Relative: 8 %
NEUTROS ABS: 5.2 10*3/uL (ref 1.7–7.7)
NEUTROS PCT: 63 %
Platelets: 277 10*3/uL (ref 150–400)
RBC: 3.3 MIL/uL — AB (ref 3.87–5.11)
RDW: 12.9 % (ref 11.5–15.5)
WBC: 8.3 10*3/uL (ref 4.0–10.5)

## 2015-12-11 LAB — COMPREHENSIVE METABOLIC PANEL
ALBUMIN: 2.9 g/dL — AB (ref 3.5–5.0)
ALK PHOS: 46 U/L (ref 38–126)
ALT: 38 U/L (ref 14–54)
AST: 63 U/L — AB (ref 15–41)
Anion gap: 6 (ref 5–15)
BILIRUBIN TOTAL: 0.6 mg/dL (ref 0.3–1.2)
BUN: 16 mg/dL (ref 6–20)
CALCIUM: 8.6 mg/dL — AB (ref 8.9–10.3)
CO2: 21 mmol/L — AB (ref 22–32)
CREATININE: 1.52 mg/dL — AB (ref 0.44–1.00)
Chloride: 111 mmol/L (ref 101–111)
GFR calc Af Amer: 40 mL/min — ABNORMAL LOW (ref >60)
GFR calc non Af Amer: 34 mL/min — ABNORMAL LOW (ref >60)
GLUCOSE: 110 mg/dL — AB (ref 65–99)
Potassium: 4 mmol/L (ref 3.5–5.1)
SODIUM: 138 mmol/L (ref 135–145)
TOTAL PROTEIN: 5.4 g/dL — AB (ref 6.5–8.1)

## 2015-12-11 LAB — GLUCOSE, CAPILLARY
Glucose-Capillary: 101 mg/dL — ABNORMAL HIGH (ref 65–99)
Glucose-Capillary: 89 mg/dL (ref 65–99)
Glucose-Capillary: 80 mg/dL (ref 65–99)
Glucose-Capillary: 84 mg/dL (ref 65–99)

## 2015-12-11 LAB — CK: Total CK: 8917 U/L — ABNORMAL HIGH (ref 38–234)

## 2015-12-11 MED ORDER — LEVETIRACETAM 500 MG PO TABS
500.0000 mg | ORAL_TABLET | Freq: Two times a day (BID) | ORAL | Status: DC
  Administered 2015-12-11 – 2015-12-13 (×5): 500 mg via ORAL
  Filled 2015-12-11 (×5): qty 1

## 2015-12-11 NOTE — Care Management Note (Signed)
Case Management Note  Patient Details  Name: Samantha Wilkinson MRN: [id number] Date of Birth: 07-Jun-1946  Subjective/Objective:                  Altered Mental Status  Action/Plan: CM spoke with patient at the bedside. She states she is not sure she needs home health services. CM explained PT, OT and HHA at home. Patient would like to think about it and provide Case Management with her decision as to whether or not she would like home care tomorrow.   Expected Discharge Date:                  Expected Discharge Plan:  [place]  In-House Referral:     Discharge planning Services  CM Consult  Post Acute Care Choice:  Home Health Choice offered to:     DME Arranged:  N/A DME Agency:  NA  HH Arranged:    [place] Agency:     Status of Service:  In process, will continue to follow  Medicare Important Message Given:  Yes Date Medicare IM Given:    Medicare IM give by:    Date Additional Medicare IM Given:    Additional Medicare Important Message give by:     If discussed at [place] of Stay Meetings, dates discussed:    Additional Comments:  Apolonio Schneiders, RN 12/11/2015, 12:56 PM

## 2015-12-11 NOTE — Care Management Important Message (Signed)
Important Message  Patient Details  Name: Samantha Wilkinson MRN: [id number] Date of Birth: February 17, 1946   Medicare Important Message Given:  Yes    Apolonio Schneiders, RN 12/11/2015, 12:49 PM

## 2015-12-11 NOTE — Progress Notes (Addendum)
[facility] PROGRESS NOTE  Samantha Wilkinson JJK:[id number] DOB: 22-Sep-1946 DOA: 12/08/2015 PCP: No primary care provider on file.  Length of stay: 3   Assessment/Plan: Principal Problem:   Altered mental status Active Problems:   Hyponatremia   Depression with anxiety   Psychogenic polydipsia   Lactic acid acidosis   Encephalopathy   Aphasia   Convulsions ([place])    1. Altered mental status. Secondary to seizures/hyponatremia Suspect seizure,with prolonged postictal phase versus partial status epilepticus that is now resolved possibly secondary to hyponatremia.Recemtly starte on zoloft. Acute CVA less likely. Infectious process is also considered given leukocytosis and elevated lactic acid.  Negative MRI brain, EEG results eft temporal sharps could be part of a sharply contoured underlying rhythm which would not be epileptiform,  received Keppra loading dose; continue Keppra 500 mg BID -  unable to drive, operate heavy machinery, perform activities at heights or participate in water activities until release by outpatient physician. This was discussed with the patient who expressed understanding - Ammonia, thyroid studies, an infectious disease workup within normal limits  Continue seizure precautions, patient would need outpatient follow-up with neurology - Continue the patient's Plavix, /aspirin, continue statin  lipid panel within normal limits and A1c 6.1, repeat in 3 months    2. Lactic acidosis, leukocytosis - Lactic acid elevated to a value of 4.59 on admission, has come down to below 2 with administration of IV fluids, leukocytosis resolved - Patient has remained afebrile with good O2 saturation on room air, chest x-ray without apparent infiltrate, UA not suggestive of infection  - Cultures no growth so far, DC antibiotics Received one dose of broad-spectrum coverage with Zosyn and vancomycin,    3. Hyponatremia due to polydipsia, zoloft and HCTZ ?  Resolved Presented with a sodium of 121 on admission, now 138, suspect dehydration Continue IV hydration secondary to rhabdomyolysis now Repeat CMP in the morning  4. Acute kidney injury, baseline creatinine around 1, creatinine peaked at 1.63> 1.52, continue on normal saline, renal failure secondary to rhabdomyolysis, CK trending up [id number]  4. Depression with anxiety - This reportedly just began within the last 6 weeks and has been debilitating as described in the history of present illness - May benefit from inpatient psychiatry consultation pending the patient's clinical course  5. Chronic HTN  - Stable  DVT prophylaxsis Lovenox  Code Status:      Code Status Orders        Start     Ordered   12/08/15 2157  Full code   Continuous     12/08/15 2206      Family Communication: Discussed in detail with the patient, all imaging results, lab results explained to the patient   Disposition Plan:  Pending resolution of renal failure    Brief narrative: 69 y.o. female with PMH of ischemic stroke with mild residual cognitive difficulties, hypertension, and 6 weeks of disabling depression and anxiety who now presents via EMS with altered mental status, initially not following commands but opening eyes to verbal stimuli. Per EMS report, she had a tongue laceration and urinary incontinence upon their arrival. Her husband and daughter-in-law are at the bedside and provide the history. Samantha Wilkinson suffered an ischemic CVA in October 2015 and his since had difficulty with memory and decision making, but has remained socially engaged and physically active until 10/30/2015. At this time, per report, she began to have bouts of severe anxiety lasting 12-24 hours at a time.  These episodes have been coming more frequent, now occurring every 2-3 days. She was started on Zoloft and Ativan in November by her neurologist, Dr. Metta Clines, but unfortunately had no appreciable improvement. She saw a  psychiatrist on December 5 of this year and was prescribed Seroquel when necessary for anxiety. The Seroquel and Ativan have reportedly put her to sleep, but upon wakening, her daughter-in-law describes an unusually high level of energy with grandiose plans lasting about 24 hours and during which the patient does not sleep or eat. Per the family's report, there is been no fevers, cough, pain complaints, or diarrhea.  In ED, patient was found to be afebrile, saturating well on room air, and with vital signs stable. She has been nonverbal in the emergency department that has followed simple commands. Her family is reportedly witnessed for episodes involving violent jerking of all 4 extremities, lasting for about a minute, and followed by lethargy. She apparently bit her tongue again during one of these episodes. Initial workup has revealed a mild leukocytosis, lactic acid of 4, and a serum sodium of 121. CT of the head was notable for chronic ischemic changes but no acute findings were reported. Neurology was consulted from the emergency department and is currently seeing the patient and left the recommendations. Ms. Deak will be admitted to the stepdown unit for ongoing evaluation and management of altered mental status suspected secondary to CVA, or more likely seizure in the setting of hyponatremia.  Consultants:  Neurology    Procedures:  None  Antibiotics: Anti-infectives    Start     Dose/Rate Route Frequency Ordered Stop   12/09/15 0600  vancomycin (VANCOCIN) IVPB 750 mg/150 ml premix  Status:  Discontinued     750 mg 150 mL/hr over 60 Minutes Intravenous Every 12 hours 12/08/15 1707 12/08/15 2206   12/08/15 1715  vancomycin (VANCOCIN) 1,250 mg in sodium chloride 0.9 % 250 mL IVPB     1,250 mg 166.7 mL/hr over 90 Minutes Intravenous  Once 12/08/15 1704 12/08/15 1910   12/08/15 1700  piperacillin-tazobactam (ZOSYN) IVPB 3.375 g     3.375 g 100 mL/hr over 30 Minutes Intravenous  Once  12/08/15 1655 12/08/15 1816   12/08/15 1700  vancomycin (VANCOCIN) IVPB 1000 mg/200 mL premix  Status:  Discontinued     1,000 mg 200 mL/hr over 60 Minutes Intravenous  Once 12/08/15 1655 12/08/15 1704         Subjective: More alert and awake  Objective: Filed Vitals:   12/10/15 1223 12/10/15 1500 12/10/15 2052 12/11/15 0504  BP: 130/68 134/65 135/64 141/73  Pulse: 70 72 79 90  Temp: 98.3 F (36.8 C) 98.2 F (36.8 C) 98.4 F (36.9 C) 98.2 F (36.8 C)  TempSrc: Oral Oral Oral Oral  Resp: 22 21 18 18   Height:      Weight:      SpO2: 100% 99% 99% 95%    Intake/Output Summary (Last 24 hours) at 12/11/15 1040 Last data filed at 12/11/15 0830  Gross per 24 hour  Intake 2122.08 ml  Output   1476 ml  Net 646.08 ml    Exam: General: Not in acute respiratory distress HEENT:  Eyes: scleral icterus or conjunctival pallor.  ENT: Right lateral tongue laceration No discharge from the ears or nose, no pharyngeal ulcers, petechiae or exudate, no tonsillar enlargement.   Neck: No JVD, no bruit, no appreciable mass Heme: No cervical adenopathy, no pallor Cardiac: S1/S2, RRR, No murmurs, No gallops or rubs. Pulm: Good  air movement bilaterally. No rales, wheezing, rhonchi or rubs. Abd: Soft, nondistended, nontender, no rebound pain or gaurding, no mass or organomegaly, BS present. Ext: No LE edema bilaterally. 2+DP/PT pulse bilaterally. Musculoskeletal: No gross deformity, no red, hot, swollen joints, no limitation in ROM  Skin: No rashes or wounds on exposed surfaces  Neuro: Awake alert and following commands reactive to light, tongue protrusion and uvula are midline, brachial and patellar reflexes are 2+ bilaterally, Babinski's sign is negative, and muscle tone is normal  Data Review   Micro Results Recent Results (from the past 240 hour(s))  Urine culture     Status: None   Collection Time: 12/08/15  4:20 PM  Result Value Ref Range Status   Specimen  Description URINE, CATHETERIZED  Final   Special Requests NONE  Final   Culture NO GROWTH 1 DAY  Final   Report Status 12/09/2015 FINAL  Final  Blood culture (routine x 2)     Status: None (Preliminary result)   Collection Time: 12/08/15  5:30 PM  Result Value Ref Range Status   Specimen Description BLOOD LEFT ANTECUBITAL  Final   Special Requests BOTTLES DRAWN AEROBIC AND ANAEROBIC 10CC  Final   Culture NO GROWTH 2 DAYS  Final   Report Status PENDING  Incomplete  Blood culture (routine x 2)     Status: None (Preliminary result)   Collection Time: 12/08/15  5:35 PM  Result Value Ref Range Status   Specimen Description BLOOD LEFT HAND  Final   Special Requests BOTTLES DRAWN AEROBIC ONLY 10CC  Final   Culture NO GROWTH 2 DAYS  Final   Report Status PENDING  Incomplete  MRSA PCR Screening     Status: None   Collection Time: 12/08/15 11:00 PM  Result Value Ref Range Status   MRSA by PCR NEGATIVE NEGATIVE Final    Comment:        The GeneXpert MRSA Assay (FDA approved for NASAL specimens only), is one component of a comprehensive MRSA colonization surveillance program. It is not intended to diagnose MRSA infection nor to guide or monitor treatment for MRSA infections.     Radiology Reports Ct Head Wo Contrast  12/08/2015  CLINICAL DATA:  Altered mental status EXAM: CT HEAD WITHOUT CONTRAST TECHNIQUE: Contiguous axial images were obtained from the base of the skull through the vertex without intravenous contrast. COMPARISON:  02/04/2015 FINDINGS: The bony calvarium is intact. Prior lacunar infarct is noted on the right in the region of the basal ganglia stable in appearance from the prior study. No findings to suggest acute hemorrhage, acute infarction or space-occupying mass lesion are noted. IMPRESSION: Chronic ischemic change without acute abnormality. Electronically Signed   By: Inez Catalina M.D.   On: 12/08/2015 15:06   Mr Brain Wo Contrast  12/08/2015  CLINICAL DATA:  Altered  mental status. Found down. Unable to speak. EXAM: MRI HEAD WITHOUT CONTRAST TECHNIQUE: Multiplanar, multiecho pulse sequences of the brain and surrounding structures were obtained without intravenous contrast. COMPARISON:  CT head earlier today.  MR head 10/21/2014. FINDINGS: No evidence for acute infarction, hemorrhage, mass lesion, hydrocephalus, or extra-axial fluid. Mild cerebral and cerebellar atrophy. Minor white matter disease. Remote RIGHT MCA territory infarction affecting the lentiform nucleus and periventricular white matter, with significant encephalomalacia and gliosis. Smaller remote LEFT occipital infarct, also without hemorrhage. No features suggestive of carbon monoxide poisoning. Flow voids are maintained in the carotid, basilar, and both vertebral arteries. No midline abnormality of significance. Extracranial soft tissues appear  unremarkable. Good general agreement with recent CT. Compared with prior MR in 2015, LEFT occipital lobe infarct was acute. IMPRESSION: Evidence for chronic cerebral ischemia. No acute intracranial findings. Electronically Signed   By: Staci Righter M.D.   On: 12/08/2015 20:14   Dg Chest Port 1 View  12/09/2015  CLINICAL DATA:  Shortness of breath.  Possible seizure EXAM: PORTABLE CHEST 1 VIEW COMPARISON:  Yesterday FINDINGS: Stable borderline cardiomegaly. Negative aortic and hilar contours. Normalized appearance of the interstitium. There is no edema, consolidation, effusion, or pneumothorax. IMPRESSION: No active disease. Electronically Signed   By: Monte Fantasia M.D.   On: 12/09/2015 14:35   Dg Chest Portable 1 View  12/08/2015  CLINICAL DATA:  69 year old female with altered mental status. Hypertension. Initial encounter. EXAM: PORTABLE CHEST 1 VIEW COMPARISON:  07/10/2015. FINDINGS: Mild central pulmonary vascular prominence. No segmental consolidation or pneumothorax. Heart size top-normal. Limited evaluation mediastinal structures without gross abnormality.  Mild acromioclavicular joint degenerative changes. IMPRESSION: Mild central pulmonary vascular prominence. No infiltrate or pneumothorax. Electronically Signed   By: Genia Del M.D.   On: 12/08/2015 15:36     CBC  Recent Labs Lab 12/08/15 1540 12/08/15 1614 12/08/15 2330 12/09/15 0409 12/10/15 0422 12/11/15 0248  WBC 14.8*  --  16.6* 15.2* 9.7 8.3  HGB 13.2 15.0 12.3 11.0* 10.2* 9.6*  HCT 37.1 44.0 33.7* 31.3* 29.7* 28.2*  PLT 366  --  337 313 287 277  MCV 81.5  --  81.6 82.6 85.3 85.5  MCH 29.0  --  29.8 29.0 29.3 29.1  MCHC 35.6  --  36.5* 35.1 34.3 34.0  RDW 12.0  --  12.2 12.4 12.9 12.9  LYMPHSABS 1.3  --   --  1.2 2.2 2.2  MONOABS 0.8  --   --  1.1* 0.8 0.6  EOSABS 0.0  --   --  0.0 0.1 0.2  BASOSABS 0.0  --   --  0.0 0.0 0.0    Chemistries   Recent Labs Lab 12/08/15 1540  12/08/15 2330 12/09/15 0409 12/09/15 0953 12/10/15 0422 12/11/15 0248  NA 121*  < > 129* 130* 133* 136 138  K 3.8  < > 4.4 4.5 4.4 4.2 4.0  CL 88*  < > 99* 103 104 107 111  CO2 20*  --  20* 19* 21* 23 21*  GLUCOSE 201*  < > 110* 93 103* 94 110*  BUN 13  < > 15 15 14 15 16   CREATININE 0.80  < > 1.17* 1.37* 1.55* 1.63* 1.52*  CALCIUM 9.5  --  8.9 8.9 9.2 8.7* 8.6*  AST 42*  --   --   --   --  44* 63*  ALT 57*  --   --   --   --  35 38  ALKPHOS 79  --   --   --   --  50 46  BILITOT 0.5  --   --   --   --  0.6 0.6  < > = values in this interval not displayed. ------------------------------------------------------------------------------------------------------------------ estimated creatinine clearance is 33 mL/min (by C-G formula based on Cr of 1.52). ------------------------------------------------------------------------------------------------------------------  Recent Labs  12/09/15 0429  HGBA1C 6.1*   ------------------------------------------------------------------------------------------------------------------  Recent Labs  12/09/15 0409  CHOL 146  HDL 57  LDLCALC 80   TRIG 45  CHOLHDL 2.6   ------------------------------------------------------------------------------------------------------------------  Recent Labs  12/08/15 1540  TSH 2.823   ------------------------------------------------------------------------------------------------------------------  Recent Labs  12/08/15 2330  VITAMINB12 701  Coagulation profile No results for input(s): INR, PROTIME in the last 168 hours.  No results for input(s): DDIMER in the last 72 hours.  Cardiac Enzymes No results for input(s): CKMB, TROPONINI, MYOGLOBIN in the last 168 hours.  Invalid input(s): CK ------------------------------------------------------------------------------------------------------------------ Invalid input(s): POCBNP   CBG:  Recent Labs Lab 12/09/15 2143 12/10/15 0756 12/10/15 1215 12/10/15 1702 12/11/15 0603  GLUCAP 109* 83 87 155* 101*       Studies: Dg Chest Port 1 View  12/09/2015  CLINICAL DATA:  Shortness of breath.  Possible seizure EXAM: PORTABLE CHEST 1 VIEW COMPARISON:  Yesterday FINDINGS: Stable borderline cardiomegaly. Negative aortic and hilar contours. Normalized appearance of the interstitium. There is no edema, consolidation, effusion, or pneumothorax. IMPRESSION: No active disease. Electronically Signed   By: Monte Fantasia M.D.   On: 12/09/2015 14:35      Lab Results  Component Value Date   HGBA1C 6.1* 12/09/2015   Lab Results  Component Value Date   LDLCALC 80 12/09/2015   CREATININE 1.52* 12/11/2015       Scheduled Meds: . aspirin  81 mg Oral Daily  . clopidogrel  75 mg Oral Daily  . enoxaparin (LOVENOX) injection  40 mg Subcutaneous Q24H  . insulin aspart  0-9 Units Subcutaneous TID WC  . levETIRAcetam  500 mg Intravenous Q12H  . pantoprazole  40 mg Oral Daily  . pravastatin  40 mg Oral q1800  . sertraline  50 mg Oral Daily   Continuous Infusions: . sodium chloride 150 mL (12/11/15 0804)    Principal Problem:    Altered mental status Active Problems:   Hyponatremia   Depression with anxiety   Psychogenic polydipsia   Lactic acid acidosis   Encephalopathy   Aphasia   Convulsions ([place])    Time spent: 45 minutes   [facility] Pager [phone]. If 7PM-7AM, please contact night-coverage at [email or web address], password [email or web address] 12/11/2015, 10:40 AM  LOS: 3 days

## 2015-12-12 LAB — COMPREHENSIVE METABOLIC PANEL
ALBUMIN: 2.9 g/dL — AB (ref 3.5–5.0)
ALK PHOS: 55 U/L (ref 38–126)
ALT: 55 U/L — ABNORMAL HIGH (ref 14–54)
ANION GAP: 7 (ref 5–15)
AST: 85 U/L — ABNORMAL HIGH (ref 15–41)
BILIRUBIN TOTAL: 0.7 mg/dL (ref 0.3–1.2)
BUN: 14 mg/dL (ref 6–20)
CO2: 21 mmol/L — ABNORMAL LOW (ref 22–32)
Calcium: 8.6 mg/dL — ABNORMAL LOW (ref 8.9–10.3)
Chloride: 110 mmol/L (ref 101–111)
Creatinine, Ser: 1.24 mg/dL — ABNORMAL HIGH (ref 0.44–1.00)
GFR, EST AFRICAN AMERICAN: 51 mL/min — AB (ref >60)
GFR, EST NON AFRICAN AMERICAN: 44 mL/min — AB (ref >60)
GLUCOSE: 94 mg/dL (ref 65–99)
POTASSIUM: 3.8 mmol/L (ref 3.5–5.1)
Sodium: 138 mmol/L (ref 135–145)
TOTAL PROTEIN: 5.9 g/dL — AB (ref 6.5–8.1)

## 2015-12-12 LAB — CBC WITH DIFFERENTIAL/PLATELET
Basophils Absolute: 0.1 10*3/uL (ref 0.0–0.1)
Basophils Relative: 1 %
EOS PCT: 2 %
Eosinophils Absolute: 0.2 10*3/uL (ref 0.0–0.7)
HEMATOCRIT: 29.3 % — AB (ref 36.0–46.0)
Hemoglobin: 9.8 g/dL — ABNORMAL LOW (ref 12.0–15.0)
LYMPHS ABS: 2.1 10*3/uL (ref 0.7–4.0)
LYMPHS PCT: 28 %
MCH: 28.6 pg (ref 26.0–34.0)
MCHC: 33.4 g/dL (ref 30.0–36.0)
MCV: 85.4 fL (ref 78.0–100.0)
MONO ABS: 0.5 10*3/uL (ref 0.1–1.0)
MONOS PCT: 6 %
NEUTROS ABS: 4.9 10*3/uL (ref 1.7–7.7)
Neutrophils Relative %: 63 %
PLATELETS: 299 10*3/uL (ref 150–400)
RBC: 3.43 MIL/uL — ABNORMAL LOW (ref 3.87–5.11)
RDW: 12.9 % (ref 11.5–15.5)
WBC: 7.7 10*3/uL (ref 4.0–10.5)

## 2015-12-12 LAB — GLUCOSE, CAPILLARY
GLUCOSE-CAPILLARY: 90 mg/dL (ref 65–99)
GLUCOSE-CAPILLARY: 128 mg/dL — AB (ref 65–99)
Glucose-Capillary: 88 mg/dL (ref 65–99)
Glucose-Capillary: 96 mg/dL (ref 65–99)

## 2015-12-12 LAB — SEDIMENTATION RATE: SED RATE: 64 mm/h — AB (ref 0–22)

## 2015-12-12 LAB — CK: Total CK: 11902 U/L — ABNORMAL HIGH (ref 38–234)

## 2015-12-12 LAB — FOLATE RBC
FOLATE, RBC: 1320 ng/mL (ref >498)
Folate, Hemolysate: 447.6 ng/mL
Hematocrit: 33.9 % — ABNORMAL LOW (ref 34.0–46.6)

## 2015-12-12 LAB — PROCALCITONIN: Procalcitonin: <0.1 ng/mL

## 2015-12-12 NOTE — Progress Notes (Signed)
[facility] PROGRESS NOTE  Samantha Wilkinson ACZ:[id number] DOB: 09-Jan-1946 DOA: 12/08/2015 PCP: No primary care provider on file.  Length of stay: 4   Assessment/Plan: Principal Problem:   Altered mental status Active Problems:   Hyponatremia   Depression with anxiety   Psychogenic polydipsia   Lactic acid acidosis   Encephalopathy   Aphasia   Convulsions ([place])    1. Altered mental status. Secondary to seizures/hyponatremia, resolved Suspect seizure,with prolonged postictal phase versus partial status epilepticus that is now resolved possibly secondary to hyponatremia.Recemtly starte on zoloft. Acute CVA less likely. Infectious process is also considered given leukocytosis and elevated lactic acid.  Negative MRI brain, EEG results eft temporal sharps could be part of a sharply contoured underlying rhythm which would not be epileptiform,  received Keppra loading dose; continue Keppra 500 mg BID -  unable to drive, operate heavy machinery, perform activities at heights or participate in water activities until release by outpatient physician. This was discussed with the patient who expressed understanding - Ammonia, thyroid studies, an infectious disease workup within normal limits  Continue seizure precautions, patient would need outpatient follow-up with neurology - Continue the patient's Plavix, /aspirin, continue statin  lipid panel within normal limits and A1c 6.1, repeat in 3 months    2. Lactic acidosis, leukocytosis - Lactic acid elevated to a value of 4.59 on admission, has come down to below 2 with administration of IV fluids, leukocytosis resolved - Patient has remained afebrile with good O2 saturation on room air, chest x-ray without apparent infiltrate, UA not suggestive of infection  - Cultures no growth so far, DC antibiotics Received one dose of broad-spectrum coverage with Zosyn and vancomycin,    3. Hyponatremia due to polydipsia, zoloft and HCTZ ?  Resolved Presented with a sodium of 121 on admission, now 138, suspect dehydration Continue IV hydration secondary to rhabdomyolysis now Repeat CMP in the morning  4. Acute kidney injury, baseline creatinine around 1, creatinine peaked at 1.63> 1.52,> 1.24,  continue on normal saline, renal failure secondary to rhabdomyolysis, CK trending up [id number] Discontinued statin Anticipate discharge tomorrow if CK improves    4. Depression with anxiety - This reportedly just began within the last 6 weeks and has been debilitating as described in the history of present illness - May benefit from inpatient psychiatry consultation pending the patient's clinical course  5. Chronic HTN  - Stable  DVT prophylaxsis Lovenox  Code Status:      Code Status Orders        Start     Ordered   12/08/15 2157  Full code   Continuous     12/08/15 2206      Family Communication: Discussed in detail with the patient, all imaging results, lab results explained to the patient   Disposition Plan:  Pending improvement of CK levels,    Brief narrative: 69 y.o. female with PMH of ischemic stroke with mild residual cognitive difficulties, hypertension, and 6 weeks of disabling depression and anxiety who now presents via EMS with altered mental status, initially not following commands but opening eyes to verbal stimuli. Per EMS report, she had a tongue laceration and urinary incontinence upon their arrival. Her husband and daughter-in-law are at the bedside and provide the history. Samantha Wilkinson suffered an ischemic CVA in October 2015 and his since had difficulty with memory and decision making, but has remained socially engaged and physically active until 10/30/2015. At this time, per report, she  began to have bouts of severe anxiety lasting 12-24 hours at a time. These episodes have been coming more frequent, now occurring every 2-3 days. She was started on Zoloft and Ativan in November by her  neurologist, Dr. Metta Clines, but unfortunately had no appreciable improvement. She saw a psychiatrist on December 5 of this year and was prescribed Seroquel when necessary for anxiety. The Seroquel and Ativan have reportedly put her to sleep, but upon wakening, her daughter-in-law describes an unusually high level of energy with grandiose plans lasting about 24 hours and during which the patient does not sleep or eat. Per the family's report, there is been no fevers, cough, pain complaints, or diarrhea.  In ED, patient was found to be afebrile, saturating well on room air, and with vital signs stable. She has been nonverbal in the emergency department that has followed simple commands. Her family is reportedly witnessed for episodes involving violent jerking of all 4 extremities, lasting for about a minute, and followed by lethargy. She apparently bit her tongue again during one of these episodes. Initial workup has revealed a mild leukocytosis, lactic acid of 4, and a serum sodium of 121. CT of the head was notable for chronic ischemic changes but no acute findings were reported. Neurology was consulted from the emergency department and is currently seeing the patient and left the recommendations. Samantha Wilkinson will be admitted to the stepdown unit for ongoing evaluation and management of altered mental status suspected secondary to CVA, or more likely seizure in the setting of hyponatremia.  Consultants:  Neurology    Procedures:  None  Antibiotics: Anti-infectives    Start     Dose/Rate Route Frequency Ordered Stop   12/09/15 0600  vancomycin (VANCOCIN) IVPB 750 mg/150 ml premix  Status:  Discontinued     750 mg 150 mL/hr over 60 Minutes Intravenous Every 12 hours 12/08/15 1707 12/08/15 2206   12/08/15 1715  vancomycin (VANCOCIN) 1,250 mg in sodium chloride 0.9 % 250 mL IVPB     1,250 mg 166.7 mL/hr over 90 Minutes Intravenous  Once 12/08/15 1704 12/08/15 1910   12/08/15 1700   piperacillin-tazobactam (ZOSYN) IVPB 3.375 g     3.375 g 100 mL/hr over 30 Minutes Intravenous  Once 12/08/15 1655 12/08/15 1816   12/08/15 1700  vancomycin (VANCOCIN) IVPB 1000 mg/200 mL premix  Status:  Discontinued     1,000 mg 200 mL/hr over 60 Minutes Intravenous  Once 12/08/15 1655 12/08/15 1704         Subjective: More alert and awake, patient back to baseline, conversive,  Objective: Filed Vitals:   12/11/15 1152 12/11/15 1154 12/11/15 1949 12/12/15 0444  BP: 159/77 155/72 152/70 152/74  Pulse: 70  65 69  Temp: 98.2 F (36.8 C)  98.1 F (36.7 C) 99 F (37.2 C)  TempSrc: Oral  Oral Oral  Resp: 20  18 18   Height:      Weight:      SpO2: 97%  95% 94%    Intake/Output Summary (Last 24 hours) at 12/12/15 1056 Last data filed at 12/12/15 0900  Gross per 24 hour  Intake   1680 ml  Output   3401 ml  Net  -1721 ml    Exam: General: Not in acute respiratory distress HEENT:  Eyes: scleral icterus or conjunctival pallor.  ENT: Right lateral tongue laceration No discharge from the ears or nose, no pharyngeal ulcers, petechiae or exudate, no tonsillar enlargement.   Neck: No JVD, no bruit, no appreciable  mass Heme: No cervical adenopathy, no pallor Cardiac: S1/S2, RRR, No murmurs, No gallops or rubs. Pulm: Good air movement bilaterally. No rales, wheezing, rhonchi or rubs. Abd: Soft, nondistended, nontender, no rebound pain or gaurding, no mass or organomegaly, BS present. Ext: No LE edema bilaterally. 2+DP/PT pulse bilaterally. Musculoskeletal: No gross deformity, no red, hot, swollen joints, no limitation in ROM  Skin: No rashes or wounds on exposed surfaces  Neuro: Awake alert following commands, but following commands. Pupils are equal round reactive to light, tongue protrusion and uvula are midline, brachial and patellar reflexes are 2+ bilaterally, Babinski's sign is negative, and muscle tone is normal  Data Review   Micro Results Recent  Results (from the past 240 hour(s))  Urine culture     Status: None   Collection Time: 12/08/15  4:20 PM  Result Value Ref Range Status   Specimen Description URINE, CATHETERIZED  Final   Special Requests NONE  Final   Culture NO GROWTH 1 DAY  Final   Report Status 12/09/2015 FINAL  Final  Blood culture (routine x 2)     Status: None (Preliminary result)   Collection Time: 12/08/15  5:30 PM  Result Value Ref Range Status   Specimen Description BLOOD LEFT ANTECUBITAL  Final   Special Requests BOTTLES DRAWN AEROBIC AND ANAEROBIC 10CC  Final   Culture NO GROWTH 3 DAYS  Final   Report Status PENDING  Incomplete  Blood culture (routine x 2)     Status: None (Preliminary result)   Collection Time: 12/08/15  5:35 PM  Result Value Ref Range Status   Specimen Description BLOOD LEFT HAND  Final   Special Requests BOTTLES DRAWN AEROBIC ONLY 10CC  Final   Culture NO GROWTH 3 DAYS  Final   Report Status PENDING  Incomplete  MRSA PCR Screening     Status: None   Collection Time: 12/08/15 11:00 PM  Result Value Ref Range Status   MRSA by PCR NEGATIVE NEGATIVE Final    Comment:        The GeneXpert MRSA Assay (FDA approved for NASAL specimens only), is one component of a comprehensive MRSA colonization surveillance program. It is not intended to diagnose MRSA infection nor to guide or monitor treatment for MRSA infections.     Radiology Reports Ct Head Wo Contrast  12/08/2015  CLINICAL DATA:  Altered mental status EXAM: CT HEAD WITHOUT CONTRAST TECHNIQUE: Contiguous axial images were obtained from the base of the skull through the vertex without intravenous contrast. COMPARISON:  02/04/2015 FINDINGS: The bony calvarium is intact. Prior lacunar infarct is noted on the right in the region of the basal ganglia stable in appearance from the prior study. No findings to suggest acute hemorrhage, acute infarction or space-occupying mass lesion are noted. IMPRESSION: Chronic ischemic change without  acute abnormality. Electronically Signed   By: Inez Catalina M.D.   On: 12/08/2015 15:06   Mr Brain Wo Contrast  12/08/2015  CLINICAL DATA:  Altered mental status. Found down. Unable to speak. EXAM: MRI HEAD WITHOUT CONTRAST TECHNIQUE: Multiplanar, multiecho pulse sequences of the brain and surrounding structures were obtained without intravenous contrast. COMPARISON:  CT head earlier today.  MR head 10/21/2014. FINDINGS: No evidence for acute infarction, hemorrhage, mass lesion, hydrocephalus, or extra-axial fluid. Mild cerebral and cerebellar atrophy. Minor white matter disease. Remote RIGHT MCA territory infarction affecting the lentiform nucleus and periventricular white matter, with significant encephalomalacia and gliosis. Smaller remote LEFT occipital infarct, also without hemorrhage. No features suggestive of  carbon monoxide poisoning. Flow voids are maintained in the carotid, basilar, and both vertebral arteries. No midline abnormality of significance. Extracranial soft tissues appear unremarkable. Good general agreement with recent CT. Compared with prior MR in 2015, LEFT occipital lobe infarct was acute. IMPRESSION: Evidence for chronic cerebral ischemia. No acute intracranial findings. Electronically Signed   By: Staci Righter M.D.   On: 12/08/2015 20:14   Dg Chest Port 1 View  12/09/2015  CLINICAL DATA:  Shortness of breath.  Possible seizure EXAM: PORTABLE CHEST 1 VIEW COMPARISON:  Yesterday FINDINGS: Stable borderline cardiomegaly. Negative aortic and hilar contours. Normalized appearance of the interstitium. There is no edema, consolidation, effusion, or pneumothorax. IMPRESSION: No active disease. Electronically Signed   By: Monte Fantasia M.D.   On: 12/09/2015 14:35   Dg Chest Portable 1 View  12/08/2015  CLINICAL DATA:  69 year old female with altered mental status. Hypertension. Initial encounter. EXAM: PORTABLE CHEST 1 VIEW COMPARISON:  07/10/2015. FINDINGS: Mild central pulmonary  vascular prominence. No segmental consolidation or pneumothorax. Heart size top-normal. Limited evaluation mediastinal structures without gross abnormality. Mild acromioclavicular joint degenerative changes. IMPRESSION: Mild central pulmonary vascular prominence. No infiltrate or pneumothorax. Electronically Signed   By: Genia Del M.D.   On: 12/08/2015 15:36     CBC  Recent Labs Lab 12/08/15 1540  12/08/15 2330 12/09/15 0409 12/10/15 0422 12/11/15 0248 12/12/15 0530  WBC 14.8*  --  16.6* 15.2* 9.7 8.3 7.7  HGB 13.2  < > 12.3 11.0* 10.2* 9.6* 9.8*  HCT 37.1  < > 33.7* 31.3* 29.7* 28.2* 29.3*  PLT 366  --  337 313 287 277 299  MCV 81.5  --  81.6 82.6 85.3 85.5 85.4  MCH 29.0  --  29.8 29.0 29.3 29.1 28.6  MCHC 35.6  --  36.5* 35.1 34.3 34.0 33.4  RDW 12.0  --  12.2 12.4 12.9 12.9 12.9  LYMPHSABS 1.3  --   --  1.2 2.2 2.2 2.1  MONOABS 0.8  --   --  1.1* 0.8 0.6 0.5  EOSABS 0.0  --   --  0.0 0.1 0.2 0.2  BASOSABS 0.0  --   --  0.0 0.0 0.0 0.1  < > = values in this interval not displayed.  Chemistries   Recent Labs Lab 12/08/15 1540  12/09/15 0409 12/09/15 0953 12/10/15 0422 12/11/15 0248 12/12/15 0530  NA 121*  < > 130* 133* 136 138 138  K 3.8  < > 4.5 4.4 4.2 4.0 3.8  CL 88*  < > 103 104 107 111 110  CO2 20*  < > 19* 21* 23 21* 21*  GLUCOSE 201*  < > 93 103* 94 110* 94  BUN 13  < > 15 14 15 16 14   CREATININE 0.80  < > 1.37* 1.55* 1.63* 1.52* 1.24*  CALCIUM 9.5  < > 8.9 9.2 8.7* 8.6* 8.6*  AST 42*  --   --   --  44* 63* 85*  ALT 57*  --   --   --  35 38 55*  ALKPHOS 79  --   --   --  50 46 55  BILITOT 0.5  --   --   --  0.6 0.6 0.7  < > = values in this interval not displayed. ------------------------------------------------------------------------------------------------------------------ estimated creatinine clearance is 40.5 mL/min (by C-G formula based on Cr of  1.24). ------------------------------------------------------------------------------------------------------------------ No results for input(s): HGBA1C in the last 72 hours. ------------------------------------------------------------------------------------------------------------------ No results for input(s): CHOL, HDL, LDLCALC,  TRIG, CHOLHDL, LDLDIRECT in the last 72 hours. ------------------------------------------------------------------------------------------------------------------ No results for input(s): TSH, T4TOTAL, T3FREE, THYROIDAB in the last 72 hours.  Invalid input(s): FREET3 ------------------------------------------------------------------------------------------------------------------ No results for input(s): VITAMINB12, FOLATE, FERRITIN, TIBC, IRON, RETICCTPCT in the last 72 hours.  Coagulation profile No results for input(s): INR, PROTIME in the last 168 hours.  No results for input(s): DDIMER in the last 72 hours.  Cardiac Enzymes No results for input(s): CKMB, TROPONINI, MYOGLOBIN in the last 168 hours.  Invalid input(s): CK ------------------------------------------------------------------------------------------------------------------ Invalid input(s): POCBNP   CBG:  Recent Labs Lab 12/11/15 0603 12/11/15 1132 12/11/15 1637 12/11/15 2102 12/12/15 0646  GLUCAP 101* 89 80 84 90       Studies: No results found.    Lab Results  Component Value Date   HGBA1C 6.1* 12/09/2015   Lab Results  Component Value Date   LDLCALC 80 12/09/2015   CREATININE 1.24* 12/12/2015       Scheduled Meds: . aspirin  81 mg Oral Daily  . clopidogrel  75 mg Oral Daily  . enoxaparin (LOVENOX) injection  40 mg Subcutaneous Q24H  . insulin aspart  0-9 Units Subcutaneous TID WC  . levETIRAcetam  500 mg Oral BID  . pantoprazole  40 mg Oral Daily  . sertraline  50 mg Oral Daily   Continuous Infusions: . sodium chloride 150 mL/hr at 12/12/15 0935     Principal Problem:   Altered mental status Active Problems:   Hyponatremia   Depression with anxiety   Psychogenic polydipsia   Lactic acid acidosis   Encephalopathy   Aphasia   Convulsions ([place])    Time spent: 45 minutes   [facility] Pager [phone]. If 7PM-7AM, please contact night-coverage at [email or web address], password [facility] 12/12/2015, 10:56 AM  LOS: 4 days

## 2015-12-12 NOTE — Care Management Note (Signed)
Case Management Note  Patient Details  Name: Samantha Wilkinson MRN: [id number] Date of Birth: 13-Jun-1946  Subjective/Objective:     Pt admitted with altered mental status               Action/Plan:  Pt is independent from home with husband.   Husband will provide the recommended supervision.    Expected Discharge Date:                  Expected Discharge Plan:  [place]  In-House Referral:     Discharge planning Services  CM Consult  Post Acute Care Choice:  Home Health Choice offered to:     DME Arranged:  N/A DME Agency:  NA  HH Arranged:   PT, OT, Aide HH Agency:   [facility]  Status of Service:  In process, will continue to follow  Medicare Important Message Given:  Yes Date Medicare IM Given:    Medicare IM give by:    Date Additional Medicare IM Given:    Additional Medicare Important Message give by:     If discussed at [place] of Stay Meetings, dates discussed:    Additional Comments: CM offered choice, pt chose [facility], agency contacted and referral accepted.   Maryclare Labrador, RN 12/12/2015, 11:30 AM

## 2015-12-12 NOTE — Evaluation (Addendum)
Occupational Therapy Evaluation Patient Details Name: Samantha Wilkinson MRN: [id number] DOB: [birth year] Today's Date: 12/12/2015    History of Present Illness pt is a 69 y.o. female with h/o stroke, depression and HTN, admitted with AMS and episodes of violent jerking of extremities now suspected as seizure likely due to hyponatremia.  Work up continues.   Clinical Impression   Pt admitted with above. Pt independent with ADLs, PTA. Feel pt will benefit from acute OT to increase independence, activity tolerance, and reinforce energy conservation techniques prior to d/c.     Follow Up Recommendations  No OT follow up;Supervision/Assistance - 24 hour    Equipment Recommendations  None recommended by OT    Recommendations for Other Services       Precautions / Restrictions Precautions Precautions: Fall Restrictions Weight Bearing Restrictions: No      Mobility Bed Mobility Overal bed mobility: Needs Assistance Bed Mobility: Supine to Sit     Supine to sit: Supervision        Transfers Overall transfer level: Needs assistance   Transfers: Sit to/from Stand Sit to Stand: Supervision              Balance    Pt walking slowly with head turns.                                         ADL Overall ADL's : Needs assistance/impaired                     Lower Body Dressing: Set up;Supervision/safety;Sit to/from stand   Toilet Transfer: Min guard;Ambulation (sit to stand from bed)           Functional mobility during ADLs:  (Min guard-Supervision) General ADL Comments: Educated on safety. Educated on energy conservation techniques as pt reports she gets fatigued at home.     Vision  Pt reports no change from baseline. Pt reports baseline vision deficits due to previous CVA.   Perception     Praxis      Pertinent Vitals/Pain Pain Assessment: No/denies pain     Hand Dominance Right   Extremity/Trunk Assessment Upper Extremity  Assessment Upper Extremity Assessment: Generalized weakness;Overall [facility] for tasks assessed   Lower Extremity Assessment Lower Extremity Assessment: Defer to PT evaluation       Communication     Cognition Arousal/Alertness: Awake/alert Behavior During Therapy: WFL for tasks assessed/performed Overall Cognitive Status: unsure of baseline; decreased short term memory                     General Comments       Exercises       Shoulder Instructions      Home Living Family/patient expects to be discharged to:: Private residence Living Arrangements: Spouse/significant other Available Help at Discharge: Family;Available 24 hours/day Type of Home: House Home Access: Stairs to enter [employer] of Steps: several Entrance Stairs-Rails: Right;Left Home Layout: One level     Bathroom Shower/Tub: Tub/shower unit;Walk-in shower (door on walk-in shower)   Bathroom Toilet: Standard     Home Equipment: Bedside commode (also reports cane)      Lives With: Spouse    Prior Functioning/Environment Level of Independence: Independent with assistive device(s)        Comments: uses cane at times    OT Diagnosis: Generalized weakness   OT Problem List: Decreased strength;Decreased knowledge  of precautions;Decreased knowledge of use of DME or AE;Impaired vision/perception;Impaired balance (sitting and/or standing);Decreased activity tolerance; decreased cognition   OT Treatment/Interventions: Self-care/ADL training;DME and/or AE instruction;Therapeutic activities;Patient/family education;Balance training;Energy conservation;Therapeutic exercise; cognitive remediation/compensation   OT Goals(Current goals can be found in the care plan section) Acute Rehab OT Goals Patient Stated Goal: get back to what she was doing before she came in OT Goal Formulation: With patient Time For Goal Achievement: 12/19/15 Potential to Achieve Goals: Good ADL Goals Pt Will Perform  Lower Body Bathing: with modified independence;sit to/from stand Pt Will Perform Lower Body Dressing: with modified independence;sit to/from stand Pt Will Transfer to Toilet: with modified independence;ambulating;regular height toilet Additional ADL Goal #1: Pt will independently verbalize 3/3 energy conservation techniques and utilize as needed in session.  OT Frequency: Min 2X/week   Barriers to D/C:            Co-evaluation              End of Session Equipment Utilized During Treatment: Gait belt  Activity Tolerance: Patient tolerated treatment well Patient left: in chair;with call bell/phone within reach;with family/visitor present   Time: 1000-1016 OT Time Calculation (min): 16 min Charges:  OT General Charges $OT Visit: 1 Procedure OT Evaluation $Initial OT Evaluation Tier I: 1 Procedure G-CodesBenito Mccreedy OTR/L [id number] 12/12/2015, 11:28 AM

## 2015-12-12 NOTE — Progress Notes (Signed)
Physical Therapy Treatment Patient Details Name: Samantha Wilkinson MRN: [id number] DOB: Nov 20, 1946 Today's Date: 12/12/2015    History of Present Illness pt is a 69 y.o. female with h/o stroke, depression and HTN, admitted with AMS and episodes of violent jerking of extremities now suspected as seizure likely due to hyponatremia.  Work up continues.    PT Comments    Progressing well with mobility.  Mild dyspnea, but no overt signs of fatigue.   Follow Up Recommendations  No PT follow up;Supervision for mobility/OOB     Equipment Recommendations  None recommended by PT    Recommendations for Other Services       Precautions / Restrictions Precautions Precautions: None Restrictions Weight Bearing Restrictions: No    Mobility  Bed Mobility Overal bed mobility: Modified Independent Bed Mobility: Supine to Sit     Supine to sit: Supervision        Transfers Overall transfer level: Modified independent   Transfers: Sit to/from Stand Sit to Stand: Supervision            Ambulation/Gait Ambulation/Gait assistance: Modified independent (Device/Increase time);Supervision Ambulation Distance (Feet): 400 Feet Assistive device: None Gait Pattern/deviations: Step-through pattern Gait velocity: functional Gait velocity interpretation: at or above normal speed for age/gender General Gait Details: smooth and steady   Stairs Stairs: Yes Stairs assistance: Supervision Stair Management: One rail Left;Alternating pattern;Forwards Number of Stairs: 5 General stair comments: safe with the rail  Wheelchair Mobility    Modified Rankin (Stroke Patients Only)       Balance Overall balance assessment: Needs assistance Sitting-balance support: No upper extremity supported Sitting balance-Leahy Scale: Normal     Standing balance support: No upper extremity supported Standing balance-Leahy Scale: Good                      Cognition Arousal/Alertness:  Awake/alert Behavior During Therapy: WFL for tasks assessed/performed Overall Cognitive Status: Within Functional Limits for tasks assessed                      Exercises General Exercises - Lower Extremity Hip ABduction/ADduction: AROM;Strengthening;Both;10 reps;Standing Hip Flexion/Marching: AROM;Strengthening;Both;10 reps;Standing Toe Raises: AROM;Strengthening;10 reps;Standing Heel Raises: AROM;Strengthening;15 reps;Standing Mini-Sqauts: AROM;Strengthening;10 reps;Standing Other Exercises Other Exercises: standing modified push up against the sink x10 reps    General Comments General comments (skin integrity, edema, etc.): SpO2 during gait  91/92%, EHR 88      Pertinent Vitals/Pain Pain Assessment: No/denies pain    Home Living Family/patient expects to be discharged to:: Private residence Living Arrangements: Spouse/significant other Available Help at Discharge: Family;Available 24 hours/day Type of Home: House Home Access: Stairs to enter Entrance Stairs-Rails: Right;Left Home Layout: One level Home Equipment: Bedside commode (also reports cane)      Prior Function Level of Independence: Independent with assistive device(s)      Comments: uses cane at times   PT Goals (current goals can now be found in the care plan section) Acute Rehab PT Goals Patient Stated Goal: get back to what she was doing before she came in PT Goal Formulation: With patient Time For Goal Achievement: 12/16/15 Potential to Achieve Goals: Good Progress towards PT goals: Progressing toward goals    Frequency  Min 3X/week    PT Plan      Co-evaluation             End of Session   Activity Tolerance: Patient tolerated treatment well Patient left: in bed;with call bell/phone within reach;with family/visitor  present     Time: [id number] PT Time Calculation (min) (ACUTE ONLY): 19 min  Charges:  $Gait Training: 8-22 mins                    G Codes:      Marqui Formby,  Tessie Fass 12/12/2015, 2:13 PM 12/12/2015  Donnella Sham, PT [phone]  (pager)

## 2015-12-12 NOTE — Evaluation (Signed)
Speech Language Pathology Evaluation Patient Details Name: Samantha Wilkinson MRN: [id number] DOB: 1946/04/26 Today's Date: 12/12/2015 Time:  -     Problem List:  Patient Active Problem List   Diagnosis Date Noted  . Aphasia   . Convulsions ([place])   . Altered mental status 12/08/2015  . Hyponatremia 12/08/2015  . Depression with anxiety 12/08/2015  . Psychogenic polydipsia 12/08/2015  . Lactic acid acidosis 12/08/2015  . Encephalopathy 12/08/2015   Past Medical History:  Past Medical History  Diagnosis Date  . Hypertension   . Stroke [facility]) october 2015  . Anxiety   . Depression    Past Surgical History: History reviewed. No pertinent past surgical history. HPI:  Samantha Wilkinson is a 69 y.o. female with altered mental status of unclear etiology. MRI negative for acute findings. PMH includes CVA, depression, anxiety.   Assessment / Plan / Recommendation Clinical Impression  Pt demonstrates normal cognitive lingusitic function. Scored WNL on MoCA. No pt/family concerns. Will sign off, no f/u recommended.     SLP Assessment  Patient does not need any further Speech Lanaguage Pathology Services    Follow Up Recommendations  None    Frequency and Duration           SLP Evaluation Prior Functioning  Cognitive/Linguistic Baseline: Within functional limits Type of Home: House  Lives With: Spouse Available Help at Discharge: Family;Available 24 hours/day   Cognition  Overall Cognitive Status: Within Functional Limits for tasks assessed Orientation Level: Oriented X4    Comprehension  Auditory Comprehension Overall Auditory Comprehension: Appears within functional limits for tasks assessed Reading Comprehension Reading Status: Within funtional limits    Expression Verbal Expression Overall Verbal Expression: Appears within functional limits for tasks assessed Written Expression Dominant Hand: Right   Oral / Motor Oral Motor/Sensory Function Overall Oral Motor/Sensory  Function: Within functional limits Motor Speech Overall Motor Speech: Appears within functional limits for tasks assessed    Samari Gorby, Katherene Ponto 12/12/2015, 9:54 AM

## 2015-12-13 DIAGNOSIS — R401 Stupor: Secondary | ICD-10-CM

## 2015-12-13 LAB — CBC WITH DIFFERENTIAL/PLATELET
BASOS ABS: 0 10*3/uL (ref 0.0–0.1)
Basophils Relative: 0 %
EOS PCT: 4 %
Eosinophils Absolute: 0.3 10*3/uL (ref 0.0–0.7)
HCT: 28.2 % — ABNORMAL LOW (ref 36.0–46.0)
Hemoglobin: 9.5 g/dL — ABNORMAL LOW (ref 12.0–15.0)
LYMPHS PCT: 23 %
Lymphs Abs: 1.8 10*3/uL (ref 0.7–4.0)
MCH: 28.7 pg (ref 26.0–34.0)
MCHC: 33.7 g/dL (ref 30.0–36.0)
MCV: 85.2 fL (ref 78.0–100.0)
Monocytes Absolute: 0.6 10*3/uL (ref 0.1–1.0)
Monocytes Relative: 8 %
Neutro Abs: 5.2 10*3/uL (ref 1.7–7.7)
Neutrophils Relative %: 65 %
PLATELETS: 280 10*3/uL (ref 150–400)
RBC: 3.31 MIL/uL — AB (ref 3.87–5.11)
RDW: 13 % (ref 11.5–15.5)
WBC: 7.8 10*3/uL (ref 4.0–10.5)

## 2015-12-13 LAB — CULTURE, BLOOD (ROUTINE X 2)
CULTURE: NO GROWTH
Culture: NO GROWTH

## 2015-12-13 LAB — BASIC METABOLIC PANEL
ANION GAP: 8 (ref 5–15)
BUN: 12 mg/dL (ref 6–20)
CALCIUM: 8.2 mg/dL — AB (ref 8.9–10.3)
CO2: 20 mmol/L — AB (ref 22–32)
Chloride: 111 mmol/L (ref 101–111)
Creatinine, Ser: 1.02 mg/dL — ABNORMAL HIGH (ref 0.44–1.00)
GFR, EST NON AFRICAN AMERICAN: 55 mL/min — AB (ref >60)
Glucose, Bld: 101 mg/dL — ABNORMAL HIGH (ref 65–99)
Potassium: 3.2 mmol/L — ABNORMAL LOW (ref 3.5–5.1)
Sodium: 139 mmol/L (ref 135–145)

## 2015-12-13 LAB — GLUCOSE, CAPILLARY
GLUCOSE-CAPILLARY: 93 mg/dL (ref 65–99)
GLUCOSE-CAPILLARY: 103 mg/dL — AB (ref 65–99)

## 2015-12-13 LAB — CK: CK TOTAL: 12239 U/L — AB (ref 38–234)

## 2015-12-13 MED ORDER — AMLODIPINE BESYLATE 5 MG PO TABS: 5.0000 mg | ORAL_TABLET | Freq: Every day | ORAL | Status: DC

## 2015-12-13 MED ORDER — POTASSIUM CHLORIDE CRYS ER 20 MEQ PO TBCR
40.0000 meq | EXTENDED_RELEASE_TABLET | Freq: Once | ORAL | Status: AC
  Administered 2015-12-13: 40 meq via ORAL

## 2015-12-13 MED ORDER — SENNOSIDES-DOCUSATE SODIUM 8.6-50 MG PO TABS: 1.0000 | ORAL_TABLET | Freq: Every evening | ORAL | Status: DC | PRN

## 2015-12-13 MED ORDER — LEVETIRACETAM 500 MG PO TABS
500.0000 mg | ORAL_TABLET | Freq: Two times a day (BID) | ORAL | Status: DC
Start: 2015-12-13 — End: 2016-03-12

## 2015-12-13 NOTE — Progress Notes (Addendum)
Occupational Therapy Treatment Patient Details Name: Samantha Wilkinson MRN: [id number] DOB: 1946-04-04 Today's Date: 12/13/2015    History of present illness pt is a 69 y.o. female with h/o stroke, depression and HTN, admitted with AMS and episodes of violent jerking of extremities now suspected as seizure likely due to hyponatremia.  Work up continues.   OT comments  Education provided in session and pt verbalized understanding of information provided.   Follow Up Recommendations  No OT follow up;Supervision/Assistance - 24 hour    Equipment Recommendations  None recommended by OT    Recommendations for Other Services      Precautions / Restrictions Restrictions Weight Bearing Restrictions: No       Mobility Bed Mobility               General bed mobility comments: not assessed-pt in chair  Transfers Overall transfer level: Modified independent                    Balance    No LOB in session.                               ADL Overall ADL's : Needs assistance/impaired                     Lower Body Dressing: Supervision/safety;Sit to/from stand (pt went and retrieved clothes from closet)   Toilet Transfer: Supervision/safety;Ambulation (Mod I-sit to stand from chair); used IV pole for most of ambulation           Functional mobility during ADLs: Supervision/safety-used IV pole for most of ambulation General ADL Comments: Educated on energy conservation techniques and provided handout. Educated on safety such as safe footwear, sitting for LB ADLs, and recommended someone be with her for shower transfer and bathing.       Vision                     Perception     Praxis      Cognition  Awake/Alert Behavior During Therapy: WFL for tasks assessed/performed Overall Cognitive Status: Within Functional Limits for tasks assessed                       Extremity/Trunk Assessment               Exercises      Shoulder Instructions       General Comments      Pertinent Vitals/ Pain       Pain Assessment: No/denies pain  Home Living                                          Prior Functioning/Environment              Frequency Min 2X/week     Progress Toward Goals  OT Goals(current goals can now be found in the care plan section)  Progress towards OT goals: Progressing toward goals  Acute Rehab OT Goals Patient Stated Goal: get back to where I was before OT Goal Formulation: With patient Time For Goal Achievement: 12/19/15 Potential to Achieve Goals: Good ADL Goals Pt Will Perform Lower Body Bathing: with modified independence;sit to/from stand Pt Will Perform Lower Body Dressing: with modified independence;sit to/from stand Pt Will Transfer to Toilet:  with modified independence;ambulating;regular height toilet Additional ADL Goal #1: Pt will independently verbalize 3/3 energy conservation techniques and utilize as needed in session.  Plan Discharge plan remains appropriate    Co-evaluation                 End of Session Equipment Utilized During Treatment: Gait belt   Activity Tolerance Patient tolerated treatment well   Patient Left in chair;with call bell/phone within reach;with family/visitor present   Nurse Communication          Time: [id number] OT Time Calculation (min): 16 min  Charges: OT General Charges $OT Visit: 1 Procedure OT Treatments $Self Care/Home Management : 8-22 mins  Benito Mccreedy OTR/L [id number] 12/13/2015, 12:14 PM

## 2015-12-13 NOTE — Discharge Summary (Addendum)
Physician Discharge Summary  Samantha Wilkinson MRN: [id number] DOB/AGE: 1946/04/23 69 y.o.  PCP: No primary care provider on file.   Admit date: 12/08/2015 Discharge date: 12/13/2015  Discharge Diagnoses:     Principal Problem:   Altered mental status Active Problems:   Hyponatremia   Depression with anxiety   Psychogenic polydipsia   Lactic acid acidosis   Encephalopathy   Aphasia   Convulsions ([place])    Follow-up recommendations Follow-up with PCP in 3-5 days , including all  additional recommended appointments as below Follow-up CBC, CMP in 3-5 days Patient needs repeat CK, BMP. On 12/16 PCP   to follow CK closely, if continues to be abnormal then consider outpatient rheumatology consultation    Medication List    STOP taking these medications        LIVALO 4 MG Tabs  Generic drug:  Pitavastatin Calcium     LORazepam 1 MG tablet  Commonly known as:  ATIVAN     QUEtiapine 25 MG tablet  Commonly known as:  SEROQUEL     valsartan-hydrochlorothiazide 320-12.5 MG tablet  Commonly known as:  DIOVAN-HCT      TAKE these medications        aspirin EC 81 MG tablet  Take 81 mg by mouth daily.     cetirizine 10 MG tablet  Commonly known as:  ZYRTEC  Take 10 mg by mouth daily.     clopidogrel 75 MG tablet  Commonly known as:  PLAVIX  Take 75 mg by mouth daily.     Fish Oil 1200 MG Caps  Take 1,200 mg by mouth daily.     levETIRAcetam 500 MG tablet  Commonly known as:  KEPPRA  Take 1 tablet (500 mg total) by mouth 2 (two) times daily.     magnesium gluconate 500 MG tablet  Commonly known as:  MAGONATE  Take 500 mg by mouth daily.     meclizine 25 MG tablet  Commonly known as:  ANTIVERT  Take 25 mg by mouth daily.     omeprazole 20 MG capsule  Commonly known as:  PRILOSEC  Take 20 mg by mouth daily.     OVER THE COUNTER MEDICATION  Place 1 drop into both eyes daily.     senna-docusate 8.6-50 MG tablet  Commonly known as:  Senokot-S  Take 1 tablet by  mouth at bedtime as needed for mild constipation.     sertraline 50 MG tablet  Commonly known as:  ZOLOFT  Take 50 mg by mouth daily.       Norvasc 5 mg by mouth daily    Discharge Condition: Stable   Discharge Instructions       Discharge Instructions    Ambulatory referral to Neurology    Complete by:  As directed   An appointment is requested in approximately: 2 -4 weeks     Diet - low sodium heart healthy    Complete by:  As directed      Increase activity slowly    Complete by:  As directed            Allergies  Allergen Reactions  . Codeine       Disposition: Final discharge disposition not confirmed   Consults: Neurology    Significant Diagnostic Studies:  Ct Head Wo Contrast  12/08/2015  CLINICAL DATA:  Altered mental status EXAM: CT HEAD WITHOUT CONTRAST TECHNIQUE: Contiguous axial images were obtained from the base of the skull through the vertex without intravenous  contrast. COMPARISON:  02/04/2015 FINDINGS: The bony calvarium is intact. Prior lacunar infarct is noted on the right in the region of the basal ganglia stable in appearance from the prior study. No findings to suggest acute hemorrhage, acute infarction or space-occupying mass lesion are noted. IMPRESSION: Chronic ischemic change without acute abnormality. Electronically Signed   By: Inez Catalina M.D.   On: 12/08/2015 15:06   Mr Brain Wo Contrast  12/08/2015  CLINICAL DATA:  Altered mental status. Found down. Unable to speak. EXAM: MRI HEAD WITHOUT CONTRAST TECHNIQUE: Multiplanar, multiecho pulse sequences of the brain and surrounding structures were obtained without intravenous contrast. COMPARISON:  CT head earlier today.  MR head 10/21/2014. FINDINGS: No evidence for acute infarction, hemorrhage, mass lesion, hydrocephalus, or extra-axial fluid. Mild cerebral and cerebellar atrophy. Minor white matter disease. Remote RIGHT MCA territory infarction affecting the lentiform nucleus and  periventricular white matter, with significant encephalomalacia and gliosis. Smaller remote LEFT occipital infarct, also without hemorrhage. No features suggestive of carbon monoxide poisoning. Flow voids are maintained in the carotid, basilar, and both vertebral arteries. No midline abnormality of significance. Extracranial soft tissues appear unremarkable. Good general agreement with recent CT. Compared with prior MR in 2015, LEFT occipital lobe infarct was acute. IMPRESSION: Evidence for chronic cerebral ischemia. No acute intracranial findings. Electronically Signed   By: Staci Righter M.D.   On: 12/08/2015 20:14   Dg Chest Port 1 View  12/09/2015  CLINICAL DATA:  Shortness of breath.  Possible seizure EXAM: PORTABLE CHEST 1 VIEW COMPARISON:  Yesterday FINDINGS: Stable borderline cardiomegaly. Negative aortic and hilar contours. Normalized appearance of the interstitium. There is no edema, consolidation, effusion, or pneumothorax. IMPRESSION: No active disease. Electronically Signed   By: Monte Fantasia M.D.   On: 12/09/2015 14:35   Dg Chest Portable 1 View  12/08/2015  CLINICAL DATA:  69 year old female with altered mental status. Hypertension. Initial encounter. EXAM: PORTABLE CHEST 1 VIEW COMPARISON:  07/10/2015. FINDINGS: Mild central pulmonary vascular prominence. No segmental consolidation or pneumothorax. Heart size top-normal. Limited evaluation mediastinal structures without gross abnormality. Mild acromioclavicular joint degenerative changes. IMPRESSION: Mild central pulmonary vascular prominence. No infiltrate or pneumothorax. Electronically Signed   By: Genia Del M.D.   On: 12/08/2015 15:36         Filed Weights   12/08/15 2200 12/09/15 0300 12/13/15 0440  Weight: 65.3 kg (143 lb 15.4 oz) 65.8 kg (145 lb 1 oz) 70.171 kg (154 lb 11.2 oz)     Microbiology: Recent Results (from the past 240 hour(s))  Urine culture     Status: None   Collection Time: 12/08/15  4:20 PM  Result  Value Ref Range Status   Specimen Description URINE, CATHETERIZED  Final   Special Requests NONE  Final   Culture NO GROWTH 1 DAY  Final   Report Status 12/09/2015 FINAL  Final  Blood culture (routine x 2)     Status: None (Preliminary result)   Collection Time: 12/08/15  5:30 PM  Result Value Ref Range Status   Specimen Description BLOOD LEFT ANTECUBITAL  Final   Special Requests BOTTLES DRAWN AEROBIC AND ANAEROBIC 10CC  Final   Culture NO GROWTH 4 DAYS  Final   Report Status PENDING  Incomplete  Blood culture (routine x 2)     Status: None (Preliminary result)   Collection Time: 12/08/15  5:35 PM  Result Value Ref Range Status   Specimen Description BLOOD LEFT HAND  Final   Special Requests BOTTLES DRAWN AEROBIC  ONLY 10CC  Final   Culture NO GROWTH 4 DAYS  Final   Report Status PENDING  Incomplete  MRSA PCR Screening     Status: None   Collection Time: 12/08/15 11:00 PM  Result Value Ref Range Status   MRSA by PCR NEGATIVE NEGATIVE Final    Comment:        The GeneXpert MRSA Assay (FDA approved for NASAL specimens only), is one component of a comprehensive MRSA colonization surveillance program. It is not intended to diagnose MRSA infection nor to guide or monitor treatment for MRSA infections.        Blood Culture    Component Value Date/Time   SDES BLOOD LEFT HAND 12/08/2015 1735   SPECREQUEST BOTTLES DRAWN AEROBIC ONLY 10CC 12/08/2015 1735   CULT NO GROWTH 4 DAYS 12/08/2015 1735   REPTSTATUS PENDING 12/08/2015 1735      Labs: Results for orders placed or performed during the hospital encounter of 12/08/15 (from the past 48 hour(s))  Glucose, capillary     Status: None   Collection Time: 12/11/15 11:32 AM  Result Value Ref Range   Glucose-Capillary 89 65 - 99 mg/dL   Comment 1 Notify RN    Comment 2 Document in Chart   Glucose, capillary     Status: None   Collection Time: 12/11/15  4:37 PM  Result Value Ref Range   Glucose-Capillary 80 65 - 99 mg/dL    Comment 1 Notify RN    Comment 2 Document in Chart   Glucose, capillary     Status: None   Collection Time: 12/11/15  9:02 PM  Result Value Ref Range   Glucose-Capillary 84 65 - 99 mg/dL  Procalcitonin     Status: None   Collection Time: 12/12/15  5:30 AM  Result Value Ref Range   Procalcitonin <0.10 ng/mL    Comment:        Interpretation: PCT (Procalcitonin) <= 0.5 ng/mL: Systemic infection (sepsis) is not likely. Local bacterial infection is possible. (NOTE)         ICU PCT Algorithm               Non ICU PCT Algorithm    ----------------------------     ------------------------------         PCT < 0.25 ng/mL                 PCT < 0.1 ng/mL     Stopping of antibiotics            Stopping of antibiotics       strongly encouraged.               strongly encouraged.    ----------------------------     ------------------------------       PCT level decrease by               PCT < 0.25 ng/mL       >= 80% from peak PCT       OR PCT 0.25 - 0.5 ng/mL          Stopping of antibiotics                                             encouraged.     Stopping of antibiotics           encouraged.    ----------------------------     ------------------------------  PCT level decrease by              PCT >= 0.25 ng/mL       < 80% from peak PCT        AND PCT >= 0.5 ng/mL            Continuin g antibiotics                                              encouraged.       Continuing antibiotics            encouraged.    ----------------------------     ------------------------------     PCT level increase compared          PCT > 0.5 ng/mL         with peak PCT AND          PCT >= 0.5 ng/mL             Escalation of antibiotics                                          strongly encouraged.      Escalation of antibiotics        strongly encouraged.   CBC with Differential/Platelet     Status: Abnormal   Collection Time: 12/12/15  5:30 AM  Result Value Ref Range   WBC 7.7 4.0 - 10.5 K/uL    RBC 3.43 (L) 3.87 - 5.11 MIL/uL   Hemoglobin 9.8 (L) 12.0 - 15.0 g/dL   HCT 29.3 (L) 36.0 - 46.0 %   MCV 85.4 78.0 - 100.0 fL   MCH 28.6 26.0 - 34.0 pg   MCHC 33.4 30.0 - 36.0 g/dL   RDW 12.9 11.5 - 15.5 %   Platelets 299 150 - 400 K/uL   Neutrophils Relative % 63 %   Neutro Abs 4.9 1.7 - 7.7 K/uL   Lymphocytes Relative 28 %   Lymphs Abs 2.1 0.7 - 4.0 K/uL   Monocytes Relative 6 %   Monocytes Absolute 0.5 0.1 - 1.0 K/uL   Eosinophils Relative 2 %   Eosinophils Absolute 0.2 0.0 - 0.7 K/uL   Basophils Relative 1 %   Basophils Absolute 0.1 0.0 - 0.1 K/uL  CK     Status: Abnormal   Collection Time: 12/12/15  5:30 AM  Result Value Ref Range   Total CK 11902 (H) 38 - 234 U/L    Comment: RESULTS CONFIRMED BY MANUAL DILUTION  Comprehensive metabolic panel     Status: Abnormal   Collection Time: 12/12/15  5:30 AM  Result Value Ref Range   Sodium 138 135 - 145 mmol/L   Potassium 3.8 3.5 - 5.1 mmol/L   Chloride 110 101 - 111 mmol/L   CO2 21 (L) 22 - 32 mmol/L   Glucose, Bld 94 65 - 99 mg/dL   BUN 14 6 - 20 mg/dL   Creatinine, Ser 1.24 (H) 0.44 - 1.00 mg/dL   Calcium 8.6 (L) 8.9 - 10.3 mg/dL   Total Protein 5.9 (L) 6.5 - 8.1 g/dL   Albumin 2.9 (L) 3.5 - 5.0 g/dL   AST 85 (H) 15 - 41 U/L   ALT 55 (H) 14 - 54 U/L  Alkaline Phosphatase 55 38 - 126 U/L   Total Bilirubin 0.7 0.3 - 1.2 mg/dL   GFR calc non Af Amer 44 (L) >60 mL/min   GFR calc Af Amer 51 (L) >60 mL/min    Comment: (NOTE) The eGFR has been calculated using the CKD EPI equation. This calculation has not been validated in all clinical situations. eGFR's persistently <60 mL/min signify possible Chronic Kidney Disease.    Anion gap 7 5 - 15  Glucose, capillary     Status: None   Collection Time: 12/12/15  6:46 AM  Result Value Ref Range   Glucose-Capillary 90 65 - 99 mg/dL  Sedimentation rate     Status: Abnormal   Collection Time: 12/12/15  9:53 AM  Result Value Ref Range   Sed Rate 64 (H) 0 - 22 mm/hr  Glucose,  capillary     Status: None   Collection Time: 12/12/15 11:22 AM  Result Value Ref Range   Glucose-Capillary 88 65 - 99 mg/dL   Comment 1 Notify RN   Glucose, capillary     Status: None   Collection Time: 12/12/15  4:08 PM  Result Value Ref Range   Glucose-Capillary 96 65 - 99 mg/dL   Comment 1 Notify RN    Comment 2 Document in Chart   Glucose, capillary     Status: Abnormal   Collection Time: 12/12/15  8:43 PM  Result Value Ref Range   Glucose-Capillary 128 (H) 65 - 99 mg/dL   Comment 1 Notify RN    Comment 2 Document in Chart   CBC with Differential/Platelet     Status: Abnormal   Collection Time: 12/13/15  5:20 AM  Result Value Ref Range   WBC 7.8 4.0 - 10.5 K/uL   RBC 3.31 (L) 3.87 - 5.11 MIL/uL   Hemoglobin 9.5 (L) 12.0 - 15.0 g/dL   HCT 28.2 (L) 36.0 - 46.0 %   MCV 85.2 78.0 - 100.0 fL   MCH 28.7 26.0 - 34.0 pg   MCHC 33.7 30.0 - 36.0 g/dL   RDW 13.0 11.5 - 15.5 %   Platelets 280 150 - 400 K/uL   Neutrophils Relative % 65 %   Neutro Abs 5.2 1.7 - 7.7 K/uL   Lymphocytes Relative 23 %   Lymphs Abs 1.8 0.7 - 4.0 K/uL   Monocytes Relative 8 %   Monocytes Absolute 0.6 0.1 - 1.0 K/uL   Eosinophils Relative 4 %   Eosinophils Absolute 0.3 0.0 - 0.7 K/uL   Basophils Relative 0 %   Basophils Absolute 0.0 0.0 - 0.1 K/uL  CK     Status: Abnormal   Collection Time: 12/13/15  5:20 AM  Result Value Ref Range   Total CK 12239 (H) 38 - 234 U/L    Comment: RESULTS CONFIRMED BY MANUAL DILUTION  Basic metabolic panel     Status: Abnormal   Collection Time: 12/13/15  5:20 AM  Result Value Ref Range   Sodium 139 135 - 145 mmol/L   Potassium 3.2 (L) 3.5 - 5.1 mmol/L   Chloride 111 101 - 111 mmol/L   CO2 20 (L) 22 - 32 mmol/L   Glucose, Bld 101 (H) 65 - 99 mg/dL   BUN 12 6 - 20 mg/dL   Creatinine, Ser 1.02 (H) 0.44 - 1.00 mg/dL   Calcium 8.2 (L) 8.9 - 10.3 mg/dL   GFR calc non Af Amer 55 (L) >60 mL/min   GFR calc Af Amer >60 >60 mL/min  Comment: (NOTE) The eGFR has been  calculated using the CKD EPI equation. This calculation has not been validated in all clinical situations. eGFR's persistently <60 mL/min signify possible Chronic Kidney Disease.    Anion gap 8 5 - 15  Glucose, capillary     Status: None   Collection Time: 12/13/15  6:31 AM  Result Value Ref Range   Glucose-Capillary 93 65 - 99 mg/dL     Lipid Panel     Component Value Date/Time   CHOL 146 12/09/2015 0409   TRIG 45 12/09/2015 0409   HDL 57 12/09/2015 0409   CHOLHDL 2.6 12/09/2015 0409   VLDL 9 12/09/2015 0409   LDLCALC 80 12/09/2015 0409     Lab Results  Component Value Date   HGBA1C 6.1* 12/09/2015     Lab Results  Component Value Date   LDLCALC 80 12/09/2015   CREATININE 1.02* 12/13/2015     HPI : 69 y.o. female with PMH of ischemic stroke with mild residual cognitive difficulties, hypertension, and 6 weeks of disabling depression and anxiety who now presents via EMS with altered mental status, initially not following commands but opening eyes to verbal stimuli. Per EMS report, she had a tongue laceration and urinary incontinence upon their arrival. Her husband and daughter-in-law are at the bedside and provide the history. Samantha Wilkinson suffered an ischemic CVA in October 2015 and his since had difficulty with memory and decision making, but has remained socially engaged and physically active until 10/30/2015. At this time, per report, she began to have bouts of severe anxiety lasting 12-24 hours at a time. These episodes have been coming more frequent, now occurring every 2-3 days. She was started on Zoloft and Ativan in November by her neurologist, Dr. Metta Clines, but unfortunately had no appreciable improvement. She saw a psychiatrist on December 5 of this year and was prescribed Seroquel when necessary for anxiety. The Seroquel and Ativan have reportedly put her to sleep, but upon wakening, her daughter-in-law describes an unusually high level of energy with grandiose plans  lasting about 24 hours and during which the patient does not sleep or eat. Per the family's report, there is been no fevers, cough, pain complaints, or diarrhea.  In ED, patient was found to be afebrile, saturating well on room air, and with vital signs stable. She has been nonverbal in the emergency department that has followed simple commands. Her family is reportedly witnessed for episodes involving violent jerking of all 4 extremities, lasting for about a minute, and followed by lethargy. She apparently bit her tongue again during one of these episodes. Initial workup has revealed a mild leukocytosis, lactic acid of 4, and a serum sodium of 121. CT of the head was notable for chronic ischemic changes but no acute findings were reported. Neurology was consulted from the emergency department and is currently seeing the patient and left the recommendations. Samantha Wilkinson will be admitted to the stepdown unit for ongoing evaluation and management of altered mental status suspected secondary to CVA, or more likely seizure in the setting of hyponatremia.  HOSPITAL COURSE:    1. Altered mental status. Secondary to seizures/hyponatremia, resolved Suspect seizure,with prolonged postictal phase versus partial status epilepticus that is now resolved possibly secondary to hyponatremia.Recemtly starte on zoloft. Acute CVA ruled out. Infectious process is also considered given leukocytosis and elevated lactic acid. Leukocytosis likely stress margination Negative MRI brain, EEG results eft temporal sharps could be part of a sharply contoured underlying rhythm which would not  be epileptiform,  Initiated on Keppra loading dose; neurology recommended to continue Keppra 500 mg BID -  unable to drive, operate heavy machinery, perform activities at heights or participate in water activities until release by outpatient physician. This was discussed with the patient who expressed understanding - Ammonia, thyroid studies,  an infectious disease workup within normal limits  Continue seizure precautions, patient would need outpatient follow-up with neurology - Continue the patient's Plavix, /aspirin, continue statin lipid panel within normal limits and A1c 6.1, repeat in 3 months    2. Lactic acidosis, leukocytosis - Lactic acid elevated to a value of 4.59 on admission, has come down to below 2 with administration of IV fluids, leukocytosis resolved - Patient has remained afebrile with good O2 saturation on room air, chest x-ray without apparent infiltrate, UA not suggestive of infection  - Cultures no growth so far, empiric antibiotics discontinued after one day Received one dose of broad-spectrum coverage with Zosyn and vancomycin,    3. Hyponatremia due to polydipsia, zoloft and HCTZ ? Resolved Presented with a sodium of 121 on admission, now 138, suspect dehydration Continue IV hydration secondary to rhabdomyolysis now Repeat CMP in the morning   4. Acute kidney injury/rhabdomyolysis baseline creatinine around 1, creatinine peaked at 1.63> 1.52,> 1.24, Hydrated aggressively with normal saline, patient was found to have renal failure secondary to rhabdomyolysis, CK trending up 2108>[id number] Discontinued statin Patient may have underlying statin-induced myopathy, vs inflammatory myopathy May benefit from outpatient hematology evaluation, continue to follow CK closely to ensure that this is trending down Renal function does not appear to be compromised at this point, creatinine within normal limits 1.02 on the day of discharge    4. Depression with anxiety - This reportedly just began within the last 6 weeks and has been debilitating as described in the history of present illness, this could be possible seizures Lorazepam, Seroquel discontinued  5. Chronic HTN  HCTZ discontinued because of severe hyponatremia, Started on Norvasc. Blood pressure in the 160s   Discharge Exam:     Blood pressure 164/77, pulse 77, temperature 98.6 F (37 C), temperature source Oral, resp. rate 18, height 5' 4" (1.626 m), weight 70.171 kg (154 lb 11.2 oz), SpO2 93 %.      Follow-up Information    Follow up with [facility].   Why:  Physical Therapy, Occupational Therapy, Aide   Contact information:   [address] [place] [address] [phone]       Schedule an appointment as soon as possible for a visit with PCP.   Why:  Friday, patient needs CK, CMP, CBC check      Signed: , 12/13/2015, 10:24 AM        Time spent >45 mins

## 2015-12-13 NOTE — Progress Notes (Signed)
Patient to D/C home with husband. D/C education done. Medical records called. Appointment for PCP follow-up made for Friday. Patient and husband with no further questions. IV removed. Tele box removed. Patient wheeled to D/C home with husband.   Domingo Dimes RN

## 2015-12-13 NOTE — Care Management Note (Signed)
Case Management Note  Patient Details  Name: Samantha Wilkinson MRN: [id number] Date of Birth: 1946-01-15  Subjective/Objective:     Pt admitted with altered mental status               Action/Plan:  Pt is independent from home with husband.   Husband will provide the recommended supervision.    Expected Discharge Date:                  Expected Discharge Plan:  [place]  In-House Referral:     Discharge planning Services  CM Consult  Post Acute Care Choice:  Home Health Choice offered to:  Patient  DME Arranged:  N/A DME Agency:  NA  HH Arranged:  PT, OT, Nurse's AidePT, OT, Aide HH Agency:   [facility]  Status of Service:  In process, will continue to follow  Medicare Important Message Given:  Yes Date Medicare IM Given:    Medicare IM give by:    Date Additional Medicare IM Given:    Additional Medicare Important Message give by:     If discussed at [place] of Stay Meetings, dates discussed:    Additional Comments: 12/13/2015 Pt will discharge home in care of husband.  12/12/15  CM offered choice, pt chose [facility], agency contacted and referral accepted.   Maryclare Labrador, RN 12/13/2015, 10:32 AM

## 2015-12-14 DIAGNOSIS — I10 Essential (primary) hypertension: Secondary | ICD-10-CM | POA: Diagnosis not present

## 2015-12-14 DIAGNOSIS — R569 Unspecified convulsions: Secondary | ICD-10-CM | POA: Diagnosis not present

## 2015-12-14 DIAGNOSIS — F418 Other specified anxiety disorders: Secondary | ICD-10-CM | POA: Diagnosis not present

## 2015-12-14 DIAGNOSIS — R262 Difficulty in walking, not elsewhere classified: Secondary | ICD-10-CM | POA: Diagnosis not present

## 2015-12-14 DIAGNOSIS — I69311 Memory deficit following cerebral infarction: Secondary | ICD-10-CM | POA: Diagnosis not present

## 2015-12-14 DIAGNOSIS — Z9181 History of falling: Secondary | ICD-10-CM | POA: Diagnosis not present

## 2015-12-14 DIAGNOSIS — Z7982 Long term (current) use of aspirin: Secondary | ICD-10-CM | POA: Diagnosis not present

## 2015-12-14 DIAGNOSIS — R4701 Aphasia: Secondary | ICD-10-CM | POA: Diagnosis not present

## 2015-12-16 DIAGNOSIS — M6282 Rhabdomyolysis: Secondary | ICD-10-CM | POA: Diagnosis not present

## 2015-12-16 DIAGNOSIS — R569 Unspecified convulsions: Secondary | ICD-10-CM | POA: Diagnosis not present

## 2015-12-16 DIAGNOSIS — R4182 Altered mental status, unspecified: Secondary | ICD-10-CM | POA: Diagnosis not present

## 2015-12-16 DIAGNOSIS — E871 Hypo-osmolality and hyponatremia: Secondary | ICD-10-CM | POA: Diagnosis not present

## 2015-12-16 DIAGNOSIS — R4701 Aphasia: Secondary | ICD-10-CM | POA: Diagnosis not present

## 2015-12-16 DIAGNOSIS — F418 Other specified anxiety disorders: Secondary | ICD-10-CM | POA: Diagnosis not present

## 2015-12-16 DIAGNOSIS — I69311 Memory deficit following cerebral infarction: Secondary | ICD-10-CM | POA: Diagnosis not present

## 2015-12-16 DIAGNOSIS — D649 Anemia, unspecified: Secondary | ICD-10-CM | POA: Diagnosis not present

## 2015-12-16 DIAGNOSIS — I10 Essential (primary) hypertension: Secondary | ICD-10-CM | POA: Diagnosis not present

## 2015-12-16 DIAGNOSIS — Z9181 History of falling: Secondary | ICD-10-CM | POA: Diagnosis not present

## 2015-12-16 DIAGNOSIS — F419 Anxiety disorder, unspecified: Secondary | ICD-10-CM | POA: Diagnosis not present

## 2015-12-16 DIAGNOSIS — R262 Difficulty in walking, not elsewhere classified: Secondary | ICD-10-CM | POA: Diagnosis not present

## 2015-12-16 DIAGNOSIS — Z7982 Long term (current) use of aspirin: Secondary | ICD-10-CM | POA: Diagnosis not present

## 2015-12-20 ENCOUNTER — Ambulatory Visit (INDEPENDENT_AMBULATORY_CARE_PROVIDER_SITE_OTHER): Payer: Medicare PPO | Admitting: Neurology

## 2015-12-20 ENCOUNTER — Encounter: Payer: Self-pay | Admitting: Neurology

## 2015-12-20 VITALS — BP 159/86 | HR 72 | Ht 64.0 in | Wt 145.5 lb

## 2015-12-20 DIAGNOSIS — Z9181 History of falling: Secondary | ICD-10-CM | POA: Diagnosis not present

## 2015-12-20 DIAGNOSIS — E871 Hypo-osmolality and hyponatremia: Secondary | ICD-10-CM | POA: Diagnosis not present

## 2015-12-20 DIAGNOSIS — I69311 Memory deficit following cerebral infarction: Secondary | ICD-10-CM | POA: Diagnosis not present

## 2015-12-20 DIAGNOSIS — R262 Difficulty in walking, not elsewhere classified: Secondary | ICD-10-CM | POA: Diagnosis not present

## 2015-12-20 DIAGNOSIS — R569 Unspecified convulsions: Secondary | ICD-10-CM

## 2015-12-20 DIAGNOSIS — Z7982 Long term (current) use of aspirin: Secondary | ICD-10-CM | POA: Diagnosis not present

## 2015-12-20 DIAGNOSIS — I10 Essential (primary) hypertension: Secondary | ICD-10-CM | POA: Diagnosis not present

## 2015-12-20 DIAGNOSIS — R4701 Aphasia: Secondary | ICD-10-CM | POA: Diagnosis not present

## 2015-12-20 DIAGNOSIS — F418 Other specified anxiety disorders: Secondary | ICD-10-CM | POA: Diagnosis not present

## 2015-12-20 NOTE — Progress Notes (Signed)
Reason for visit: Seizures  Referring physician: [facility]  HELEM KWASNIK is a 69 y.o. female  History of present illness:  Ms. Kaupp is a 69 year old right-handed white female with a history of a stroke event that affected the right brain in October 2015. The patient had recovered relatively well, but in November 2016, she began having episodes of anxiety, and what were thought to be panic attacks. The patient would have spells of increased heart rate, and a sensation of anxiety. The patient went to the hospital on 12/08/2015 after a witnessed seizure event. The patient felt poorly that day, and she elt nauseated. She felt quite dizzy and she herself called 911. The patient tried to get over to the chair, but then apparently lost consciousness. The patient had tongue biting, generalized jerking. Upon admission to the hospital, she was noted to have a low sodium level of 121. The patient had at least 5 or 6 more seizures in the hospital, she had rhabdomyolysis, and acute renal failure associated with this. The patient had diffuse muscle soreness, with an increased white count associated with the seizure. The patient has had no residual new numbness or weakness of the face, arms, or legs. She was placed on Keppra. A video EEG monitor study was normal. MRI of the brain did not show acute changes, with an chronic right middle cerebral artery distribution stroke and a left occipital stroke were noted. The patient is doing well on the Rebecca, she was told to restrict fluids, take some salt in the diet. She still has some residual dizziness and fatigue issues since the hospitalization, she comes to the office today for an evaluation.  Past Medical History  Diagnosis Date  . Hypertension   . Stroke [facility]) october 2015  . Anxiety   . Depression   . Hyperlipidemia     Past Surgical History  Procedure Laterality Date  . Abdominal hysterectomy    . Back surgery    . Locked left shoulder    .  Shoulder surgery Right   . Elbow surgery Right   . Knee surgery Right   . Cholecystectomy      Family History  Problem Relation Age of Onset  . Seizures Sister   . Hypertension Sister   . Heart attack Mother   . Diabetes Brother   . Kidney failure Brother   . Hypertension Brother   . Heart attack Father     Social history:  reports that she quit smoking about 50 years ago. She has never used smokeless tobacco. She reports that she does not drink alcohol or use illicit drugs.  Medications:  Prior to Admission medications   Medication Sig Start Date End Date Taking? Authorizing Provider  amLODipine (NORVASC) 5 MG tablet Take 1 tablet (5 mg total) by mouth daily. 12/13/15  Yes Reyne Dumas, MD  aspirin EC 81 MG tablet Take 81 mg by mouth daily.   Yes Historical Provider, MD  cetirizine (ZYRTEC) 10 MG tablet Take 10 mg by mouth daily.   Yes Historical Provider, MD  clopidogrel (PLAVIX) 75 MG tablet Take 75 mg by mouth daily.   Yes Historical Provider, MD  levETIRAcetam (KEPPRA) 500 MG tablet Take 1 tablet (500 mg total) by mouth 2 (two) times daily. 12/13/15  Yes Reyne Dumas, MD  magnesium gluconate (MAGONATE) 500 MG tablet Take 500 mg by mouth daily.   Yes Historical Provider, MD  meclizine (ANTIVERT) 25 MG tablet Take 25 mg by mouth daily.  Yes Historical Provider, MD  omeprazole (PRILOSEC) 20 MG capsule Take 20 mg by mouth daily.   Yes Historical Provider, MD  OVER THE COUNTER MEDICATION Place 1 drop into both eyes daily.   Yes Historical Provider, MD  sertraline (ZOLOFT) 50 MG tablet Take 50 mg by mouth daily.   Yes Historical Provider, MD      Allergies  Allergen Reactions  . Codeine     ROS:  Out of a complete 14 system review of symptoms, the patient complains only of the following symptoms, and all other reviewed systems are negative.  Fatigue Blurred vision Anemia Memory loss, confusion, dizziness, seizures Anxiety, decreased energy  Blood pressure 159/86,  pulse 72, height 5\' 4"  (1.626 m), weight 145 lb 8 oz (65.998 kg).  Physical Exam  General: The patient is alert and cooperative at the time of the examination.  Eyes: Pupils are equal, round, and reactive to light. Discs are flat bilaterally.  Neck: The neck is supple, no carotid bruits are noted.  Respiratory: The respiratory examination is clear.  Cardiovascular: The cardiovascular examination reveals a regular rate and rhythm, no obvious murmurs or rubs are noted.  Skin: Extremities are without significant edema.  Neurologic Exam  Mental status: The patient is alert and oriented x 3 at the time of the examination. The patient has apparent normal recent and remote memory, with an apparently normal attention span and concentration ability.  Cranial nerves: Facial symmetry is present. There is good sensation of the face to pinprick and soft touch bilaterally. The strength of the facial muscles and the muscles to head turning and shoulder shrug are normal bilaterally. Speech is well enunciated, no aphasia or dysarthria is noted. Extraocular movements are full. Visual fields are full. The tongue is midline, and the patient has symmetric elevation of the soft palate. No obvious hearing deficits are noted.  Motor: The motor testing reveals 5 over 5 strength of all 4 extremities. Good symmetric motor tone is noted throughout.  Sensory: Sensory testing is intact to pinprick, soft touch, vibration sensation, and position sense on all 4 extremities. No evidence of extinction is noted.  Coordination: Cerebellar testing reveals good finger-nose-finger and heel-to-shin bilaterally.  Gait and station: Gait is normal. Tandem gait is normal. Romberg is negative. No drift is seen.  Reflexes: Deep tendon reflexes are symmetric and normal bilaterally. Toes are downgoing bilaterally.   Assessment/Plan:  1. Hyponatremia  2. Right brain stroke event 2015  3. Seizures  The patient has had low  sodium levels, likely related to water toxicity. The patient was drinking more than 2 gallons of water a day prior to admission. She has reduced her water intake at this point. The patient also has a prior right brain stroke. The patient currently is on Keppra, she is not to operate a motor vehicle for least 6 months following the seizure. She will continue the [place] for now, and follow-up in 4 months. The patient will contact our office if she is having any further events.  Jill Alexanders MD 12/20/2015 7:38 PM  [facility] [address] [place], [place] [address]  Phone [phone] Fax [phone]

## 2015-12-20 NOTE — Patient Instructions (Signed)
Seizure, Adult A seizure is abnormal electrical activity in the brain. Seizures usually last from 30 seconds to 2 minutes. There are various types of seizures. Before a seizure, you may have a warning sensation (aura) that a seizure is about to occur. An aura may include the following symptoms:   Fear or anxiety.  Nausea.  Feeling like the room is spinning (vertigo).  Vision changes, such as seeing flashing lights or spots. Common symptoms during a seizure include:  A change in attention or behavior (altered mental status).  Convulsions with rhythmic jerking movements.  Drooling.  Rapid eye movements.  Grunting.  Loss of bladder and bowel control.  Bitter taste in the mouth.  Tongue biting. After a seizure, you may feel confused and sleepy. You may also have an injury resulting from convulsions during the seizure. HOME CARE INSTRUCTIONS   If you are given medicines, take them exactly as prescribed by your health care provider.  Keep all follow-up appointments as directed by your health care provider.  Do not swim or drive or engage in risky activity during which a seizure could cause further injury to you or others until your health care provider says it is OK.  Get adequate rest.  Teach friends and family what to do if you have a seizure. They should:  Lay you on the ground to prevent a fall.  Put a cushion under your head.  Loosen any tight clothing around your neck.  Turn you on your side. If vomiting occurs, this helps keep your airway clear.  Stay with you until you recover.  Know whether or not you need emergency care. SEEK IMMEDIATE MEDICAL CARE IF:  The seizure lasts longer than 5 minutes.  The seizure is severe or you do not wake up immediately after the seizure.  You have an altered mental status after the seizure.  You are having more frequent or worsening seizures. Someone should drive you to the emergency department or call local emergency  services (911 in [place] MAKE SURE YOU:  Understand these instructions.  Will watch your condition.  Will get help right away if you are not doing well or get worse.   This information is not intended to replace advice given to you by your health care provider. Make sure you discuss any questions you have with your health care provider.   Document Released: 12/14/2000 Document Revised: 01/07/2015 Document Reviewed: 07/29/2013 Elsevier Interactive Patient Education [employer].

## 2015-12-21 DIAGNOSIS — I69311 Memory deficit following cerebral infarction: Secondary | ICD-10-CM | POA: Diagnosis not present

## 2015-12-21 DIAGNOSIS — R262 Difficulty in walking, not elsewhere classified: Secondary | ICD-10-CM | POA: Diagnosis not present

## 2015-12-21 DIAGNOSIS — Z9181 History of falling: Secondary | ICD-10-CM | POA: Diagnosis not present

## 2015-12-21 DIAGNOSIS — R4701 Aphasia: Secondary | ICD-10-CM | POA: Diagnosis not present

## 2015-12-21 DIAGNOSIS — Z7982 Long term (current) use of aspirin: Secondary | ICD-10-CM | POA: Diagnosis not present

## 2015-12-21 DIAGNOSIS — F418 Other specified anxiety disorders: Secondary | ICD-10-CM | POA: Diagnosis not present

## 2015-12-21 DIAGNOSIS — R569 Unspecified convulsions: Secondary | ICD-10-CM | POA: Diagnosis not present

## 2015-12-21 DIAGNOSIS — I10 Essential (primary) hypertension: Secondary | ICD-10-CM | POA: Diagnosis not present

## 2015-12-22 DIAGNOSIS — M6282 Rhabdomyolysis: Secondary | ICD-10-CM | POA: Diagnosis not present

## 2015-12-23 DIAGNOSIS — F418 Other specified anxiety disorders: Secondary | ICD-10-CM | POA: Diagnosis not present

## 2015-12-23 DIAGNOSIS — Z9181 History of falling: Secondary | ICD-10-CM | POA: Diagnosis not present

## 2015-12-23 DIAGNOSIS — I69311 Memory deficit following cerebral infarction: Secondary | ICD-10-CM | POA: Diagnosis not present

## 2015-12-23 DIAGNOSIS — R569 Unspecified convulsions: Secondary | ICD-10-CM | POA: Diagnosis not present

## 2015-12-23 DIAGNOSIS — Z7982 Long term (current) use of aspirin: Secondary | ICD-10-CM | POA: Diagnosis not present

## 2015-12-23 DIAGNOSIS — I10 Essential (primary) hypertension: Secondary | ICD-10-CM | POA: Diagnosis not present

## 2015-12-23 DIAGNOSIS — R262 Difficulty in walking, not elsewhere classified: Secondary | ICD-10-CM | POA: Diagnosis not present

## 2015-12-23 DIAGNOSIS — R4701 Aphasia: Secondary | ICD-10-CM | POA: Diagnosis not present

## 2015-12-28 DIAGNOSIS — R569 Unspecified convulsions: Secondary | ICD-10-CM | POA: Diagnosis not present

## 2015-12-28 DIAGNOSIS — F418 Other specified anxiety disorders: Secondary | ICD-10-CM | POA: Diagnosis not present

## 2015-12-28 DIAGNOSIS — I69311 Memory deficit following cerebral infarction: Secondary | ICD-10-CM | POA: Diagnosis not present

## 2015-12-28 DIAGNOSIS — R4701 Aphasia: Secondary | ICD-10-CM | POA: Diagnosis not present

## 2015-12-28 DIAGNOSIS — R262 Difficulty in walking, not elsewhere classified: Secondary | ICD-10-CM | POA: Diagnosis not present

## 2015-12-28 DIAGNOSIS — Z9181 History of falling: Secondary | ICD-10-CM | POA: Diagnosis not present

## 2015-12-28 DIAGNOSIS — I10 Essential (primary) hypertension: Secondary | ICD-10-CM | POA: Diagnosis not present

## 2015-12-28 DIAGNOSIS — Z7982 Long term (current) use of aspirin: Secondary | ICD-10-CM | POA: Diagnosis not present

## 2016-01-19 DIAGNOSIS — R4701 Aphasia: Secondary | ICD-10-CM

## 2016-01-19 DIAGNOSIS — R569 Unspecified convulsions: Secondary | ICD-10-CM

## 2016-01-19 HISTORY — DX: Unspecified convulsions: R56.9

## 2016-01-19 HISTORY — DX: Aphasia: R47.01

## 2016-02-28 IMAGING — MR MR HEAD W/O CM
9 of 10 series · 36 of 48 positions shown · non-contrast
Comparison: CT head earlier today.  MR head 10/21/2014.

CLINICAL DATA: Altered mental status. Found down. Unable to speak.

EXAM:
MRI HEAD WITHOUT CONTRAST
TECHNIQUE: Multiplanar, multiecho pulse sequences of the brain and surrounding
structures were obtained without intravenous contrast.

[Series 3: DWI · axial · 3.0mm · 1.09mm/px · z∈[-56,+82]mm · 9 of 94 slices shown (1 of 4)]
[im 1/94]
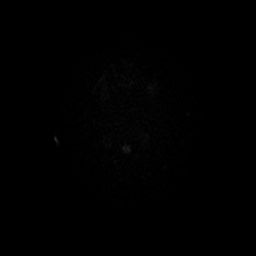
[im 12/94]
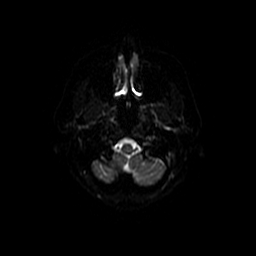
[im 24/94]
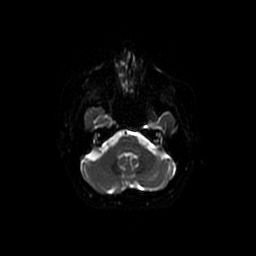
[im 35/94]
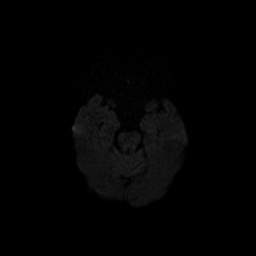
[im 47/94]
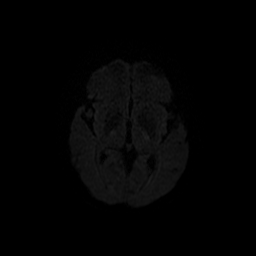
[im 59/94]
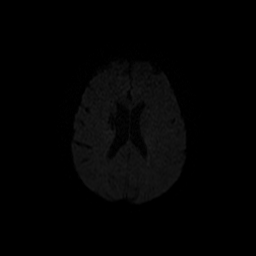
[im 70/94]
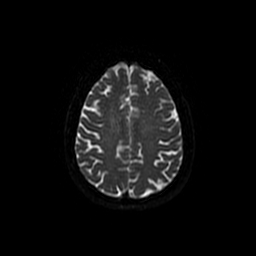
[im 82/94]
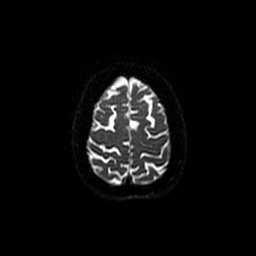
[im 94/94]
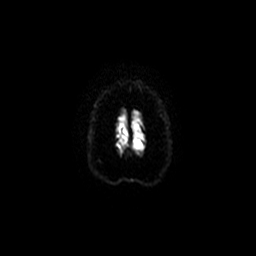

[Series 4: DWI · coronal · 5.0mm · 1.09mm/px · 7 of 66 slices shown (2 of 4)]
[im 1/66]
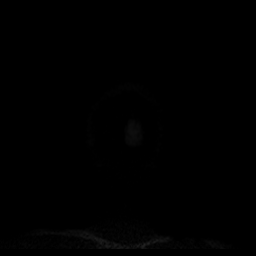
[im 11/66]
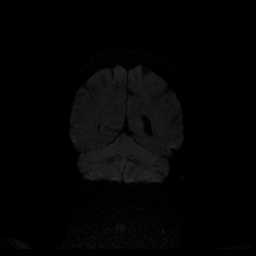
[im 22/66]
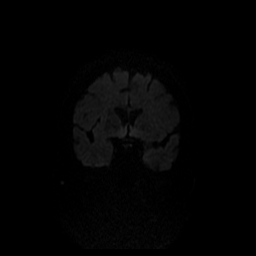
[im 33/66]
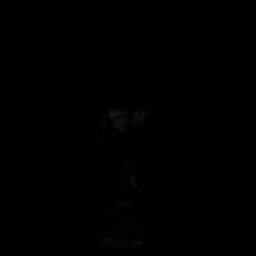
[im 44/66]
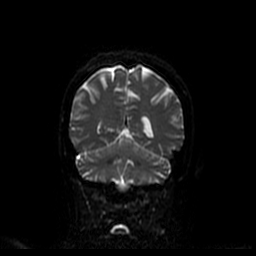
[im 55/66]
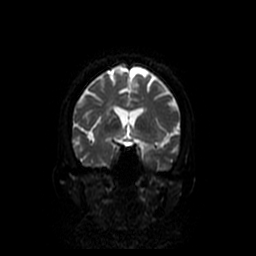
[im 66/66]
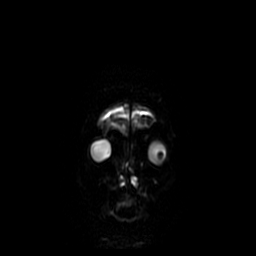

[Series 5: T1 · sagittal · 5.0mm · 0.47mm/px · 2 of 23 slices shown]
[im 1/23]
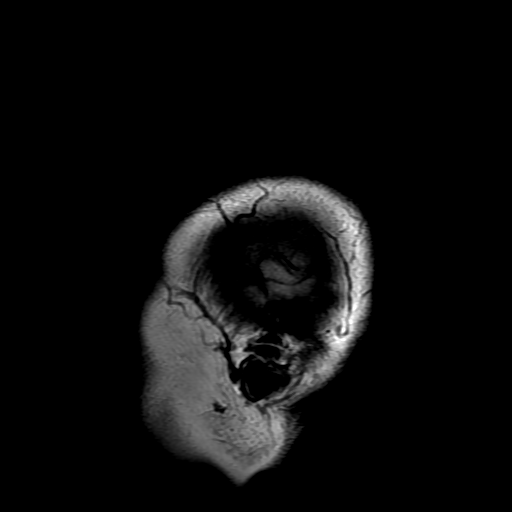
[im 23/23]
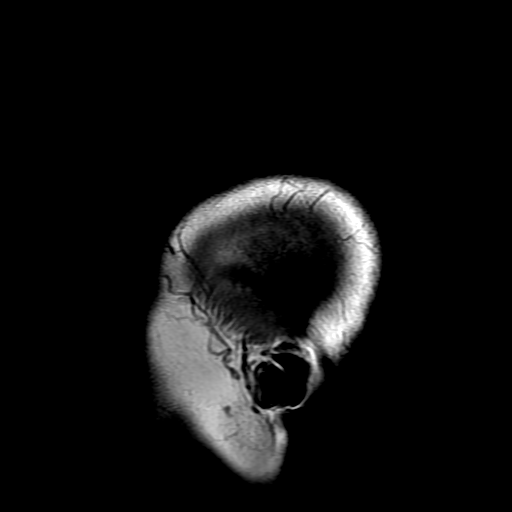

[Series 6: T2 · axial · 5.0mm · 0.43mm/px · z∈[-56,+82]mm · 3 of 24 slices shown (1 of 2)]
[im 1/24]
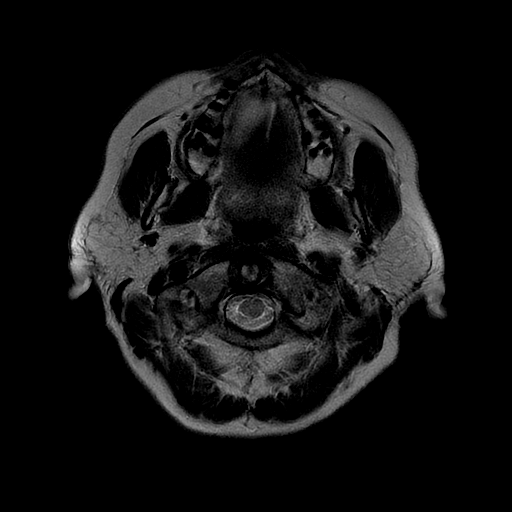
[im 12/24]
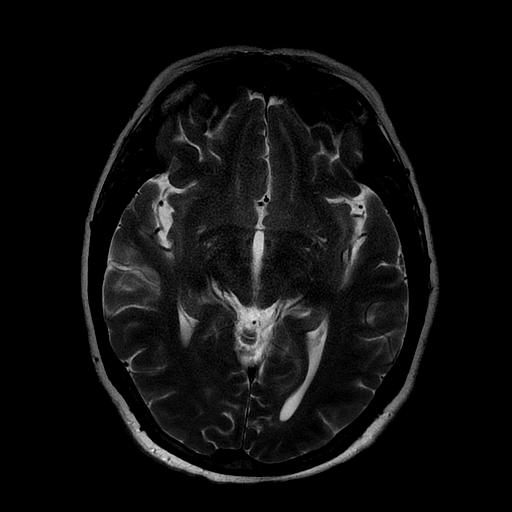
[im 24/24]
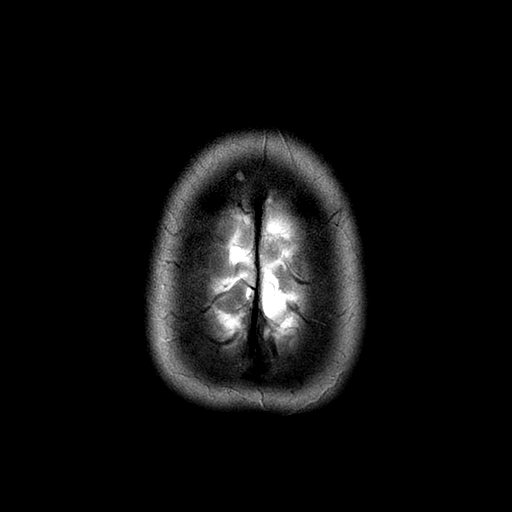

[Series 7: FLAIR · axial · 5.0mm · 0.43mm/px · z∈[-56,+82]mm · 3 of 24 slices shown]
[im 1/24]
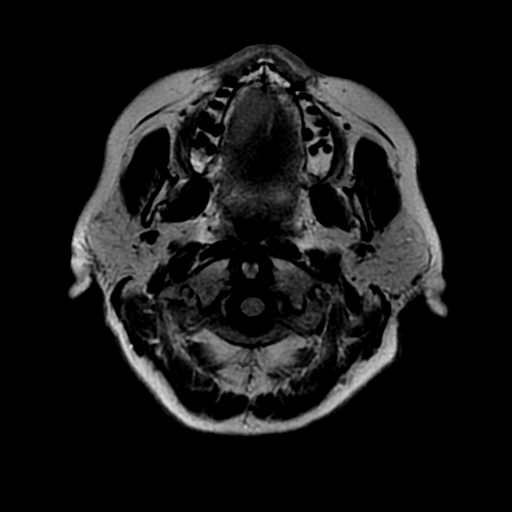
[im 12/24]
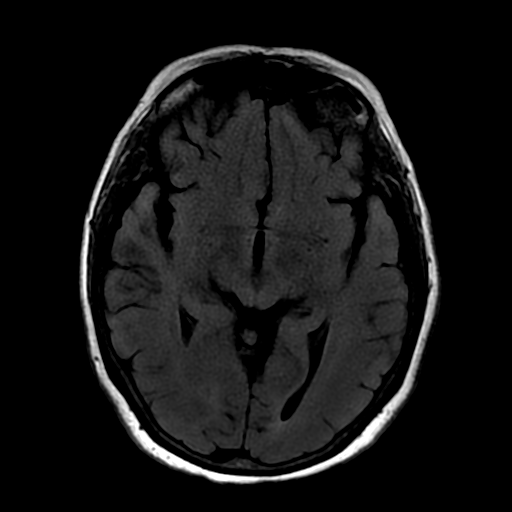
[im 24/24]
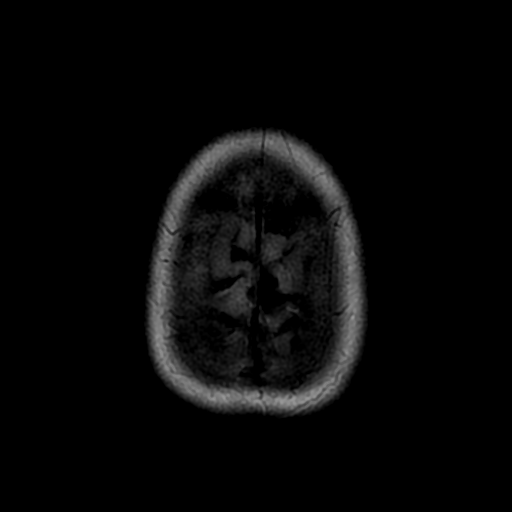

[Series 8: ax mpgr · axial · 5.0mm · 0.43mm/px · 1 of 24 slices shown]
[im 1/24]
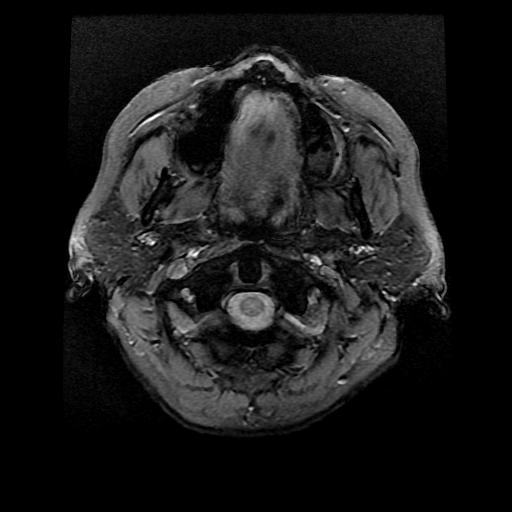

[Series 10: T2 · coronal · 5.0mm · 0.43mm/px · 3 of 28 slices shown (2 of 2)]
[im 1/28]
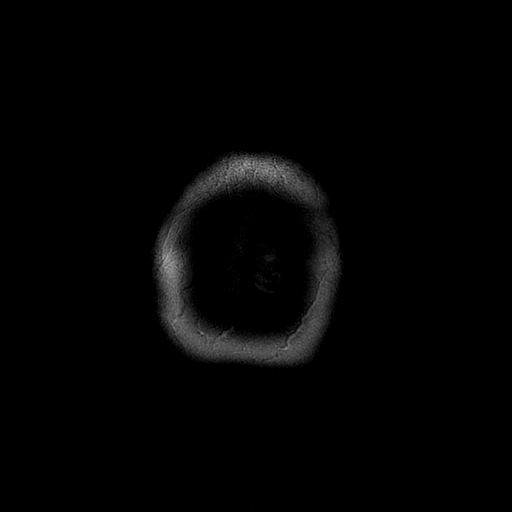
[im 14/28]
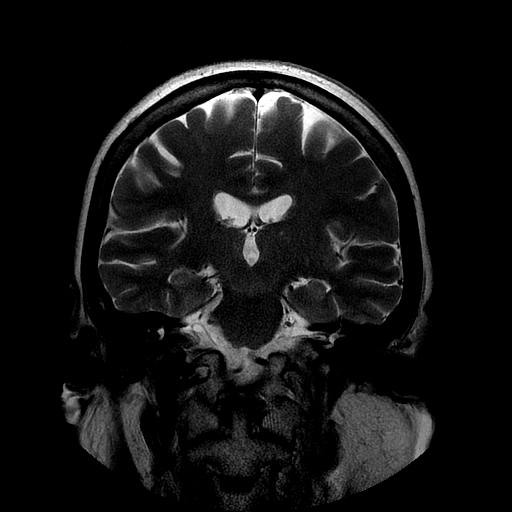
[im 28/28]
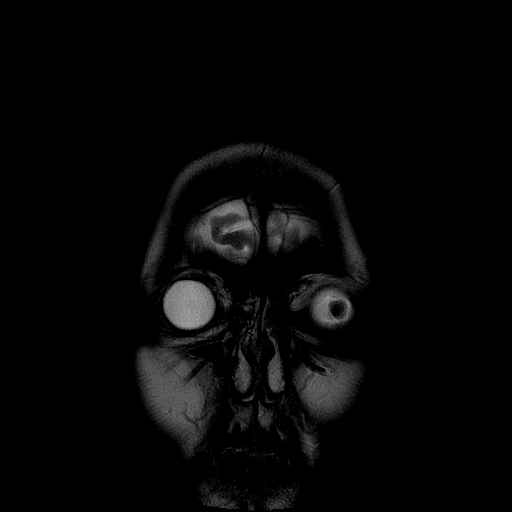

[Series 300: DWI · axial · 3.0mm · 1.09mm/px · z∈[-56,+82]mm · 5 of 47 slices shown (3 of 4)]
[im 1/47]
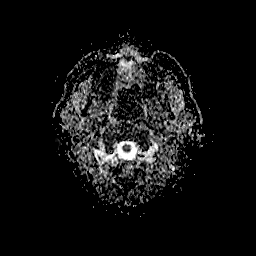
[im 12/47]
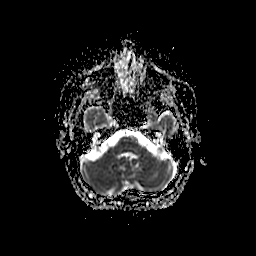
[im 24/47]
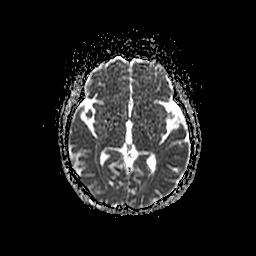
[im 35/47]
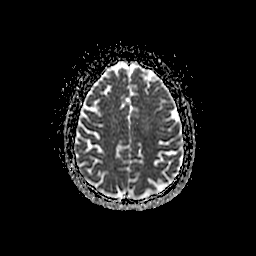
[im 47/47]
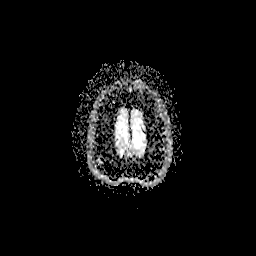

[Series 400: DWI · coronal · 5.0mm · 1.09mm/px · 3 of 33 slices shown (4 of 4)]
[im 1/33]
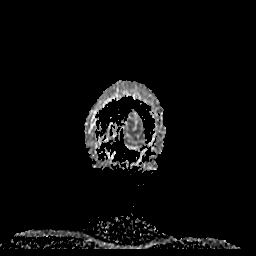
[im 17/33]
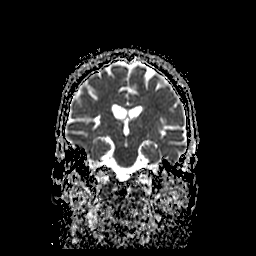
[im 33/33]
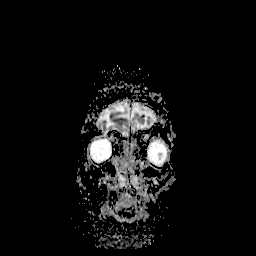

[36 of 48 positions shown; findings below may reference images not displayed]

FINDINGS: No evidence for acute infarction, hemorrhage, mass lesion,
hydrocephalus, or extra-axial fluid. Mild cerebral and cerebellar
atrophy. Minor white matter disease. Remote RIGHT MCA territory
infarction affecting the lentiform nucleus and periventricular white
matter, with significant encephalomalacia and gliosis. Smaller
remote LEFT occipital infarct, also without hemorrhage. No features
suggestive of carbon monoxide poisoning.

Flow voids are maintained in the carotid, basilar, and both
vertebral arteries. No midline abnormality of significance.
Extracranial soft tissues appear unremarkable.

Good general agreement with recent CT. Compared with prior MR in
3727, LEFT occipital lobe infarct was acute.
IMPRESSION: Evidence for chronic cerebral ischemia.

No acute intracranial findings.

## 2016-02-28 IMAGING — CT CT HEAD W/O CM
2 series · 15 of 30 positions shown, 17 images · non-contrast
Comparison: 02/04/2015

CLINICAL DATA: Altered mental status

EXAM:
CT HEAD WITHOUT CONTRAST
TECHNIQUE: Contiguous axial images were obtained from the base of the skull
through the vertex without intravenous contrast.

[Series 2: head bone · axial · 0.42mm/px · z∈[+1617,+1737]mm · 8 of 76 slices shown]
[im 8/76  bone]
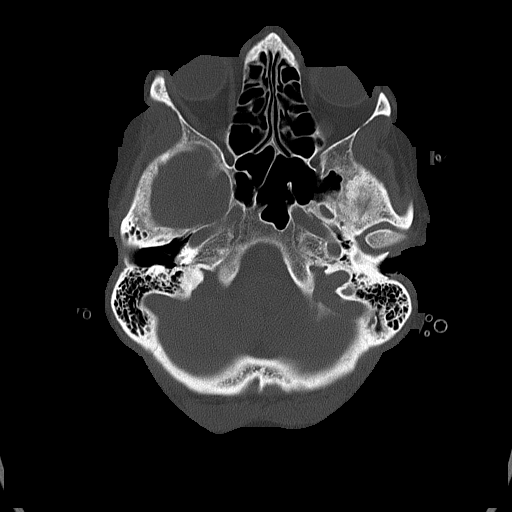
[im 16/76  bone]
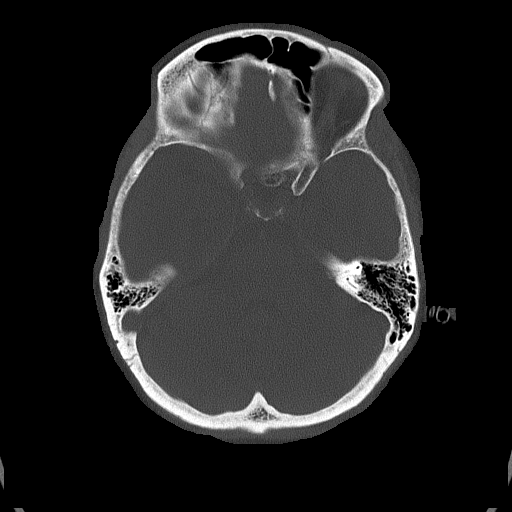
[im 23/76  bone]
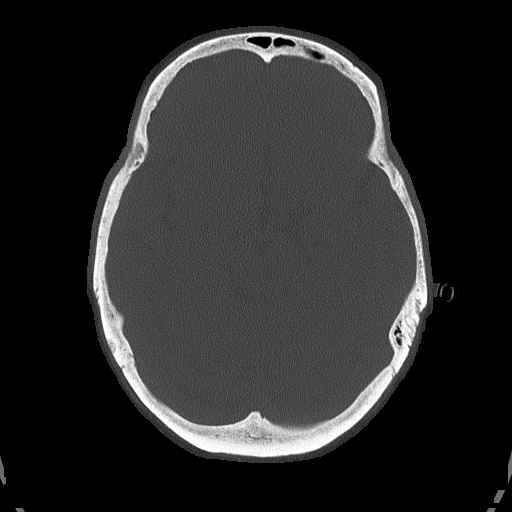
[im 34/76  bone]
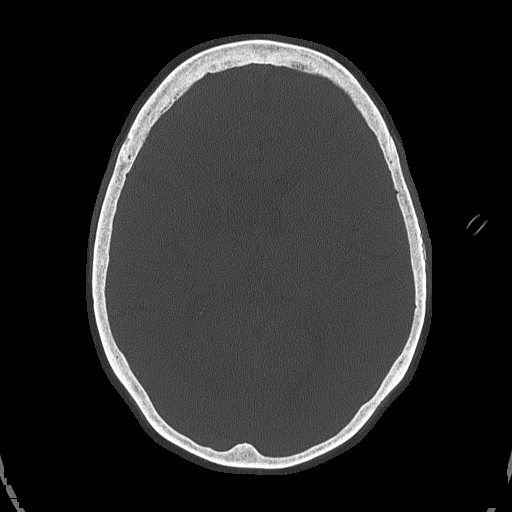
[im 42/76  bone]
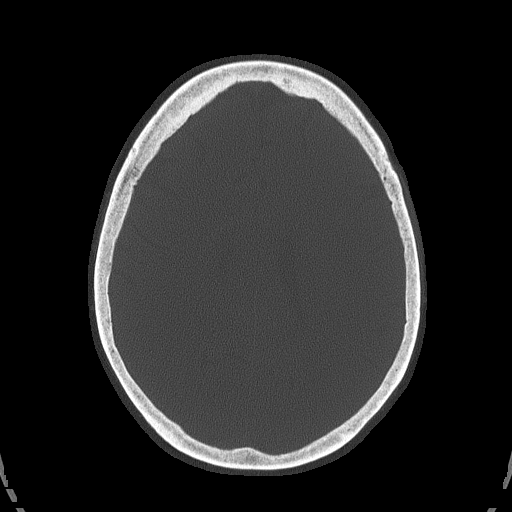
[im 53/76  bone]
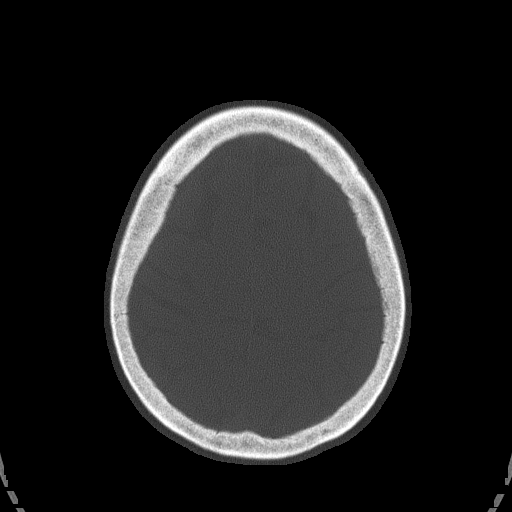
[im 61/76  bone]
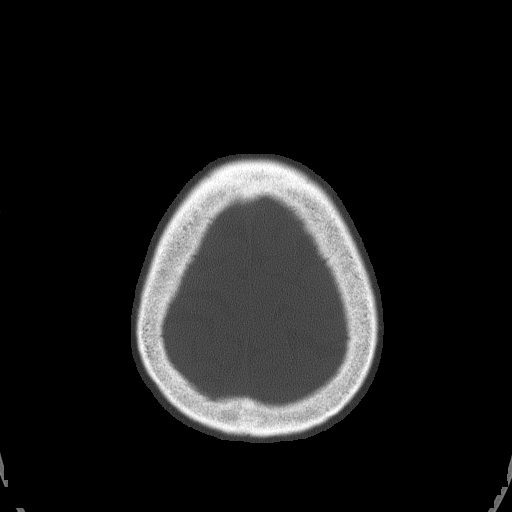
[im 68/76  bone]
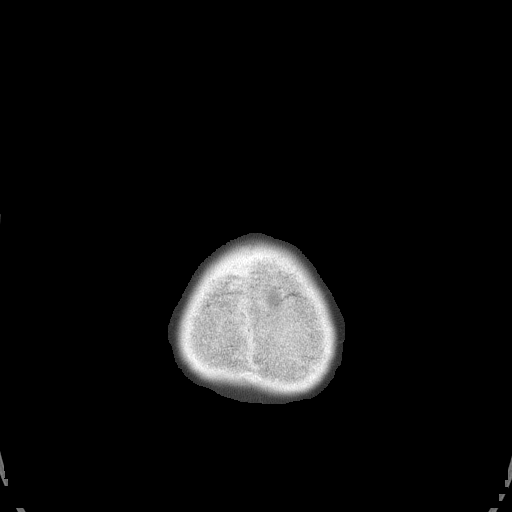

[Series 3: head without · axial · non-contrast · 0.42mm/px · z∈[+1618,+1733]mm · 7 of 31 slices shown, 9 images]
[im 4/31  brain]
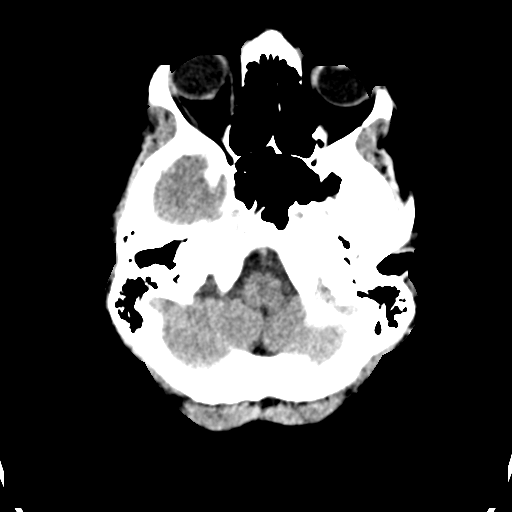
[im 4/31  bone]
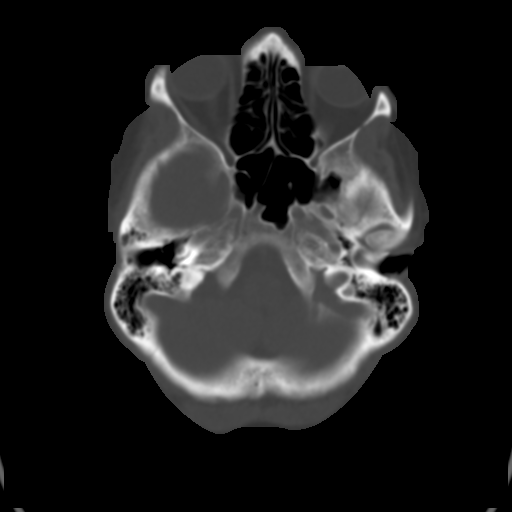
[im 8/31  brain]
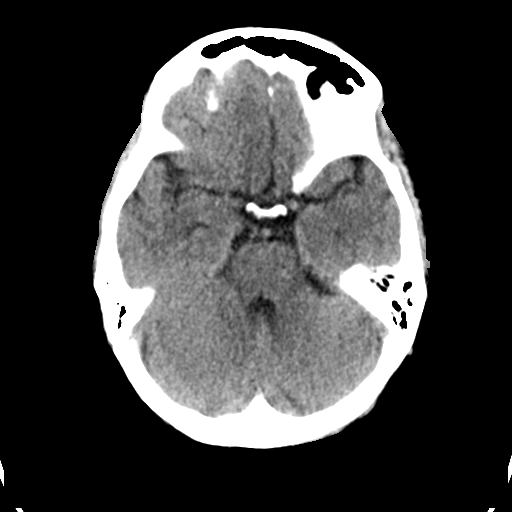
[im 12/31  brain]
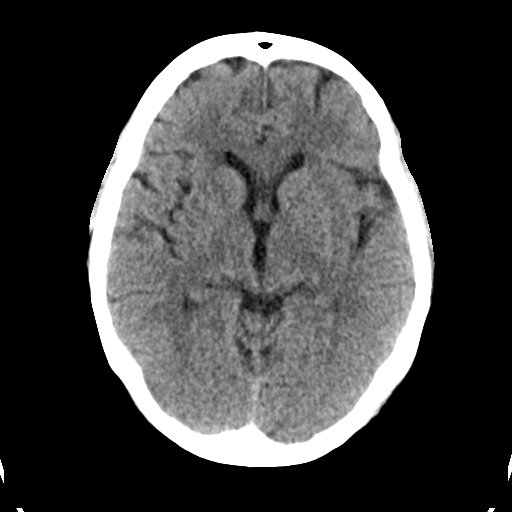
[im 16/31  brain]
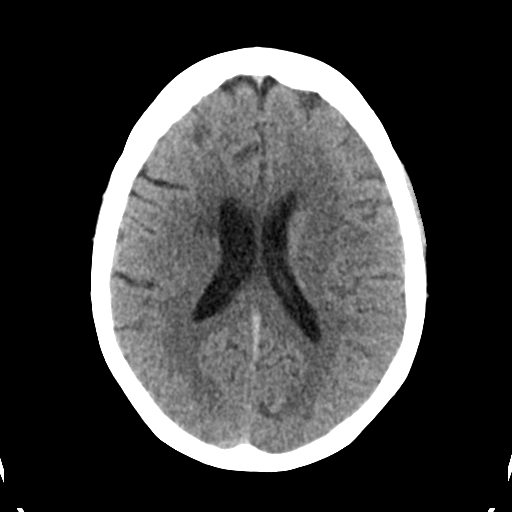
[im 19/31  brain]
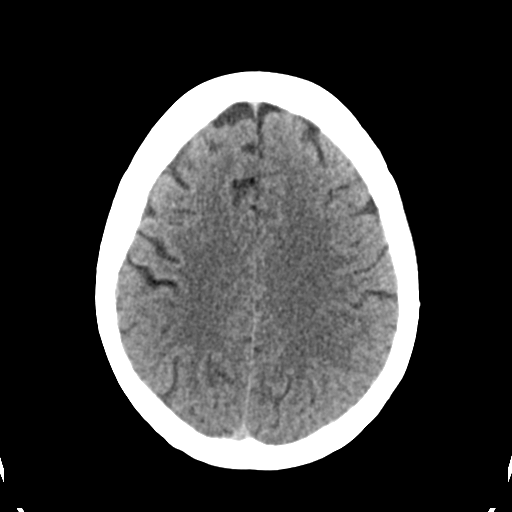
[im 19/31  bone]
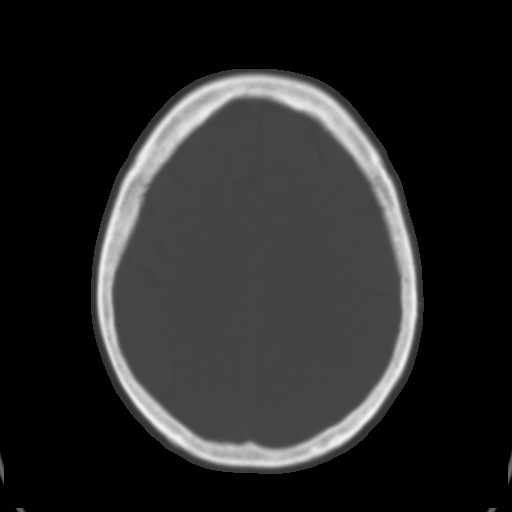
[im 23/31  brain]
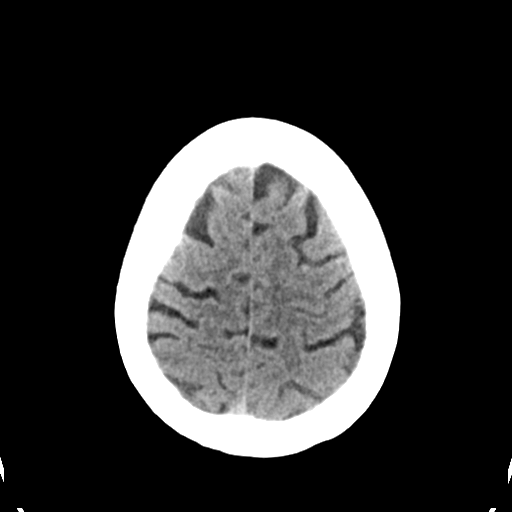
[im 27/31  brain]
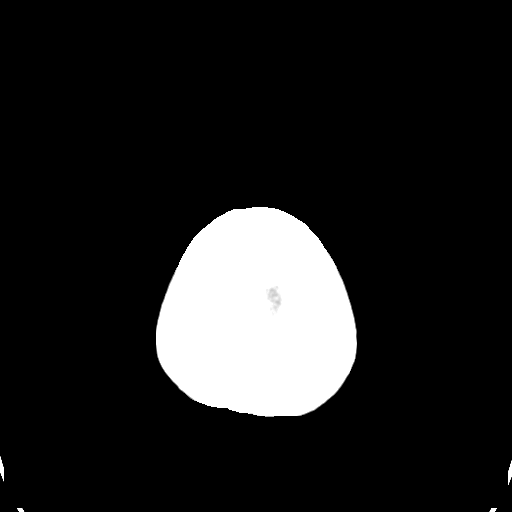

[15 of 30 positions shown; findings below may reference images not displayed]

FINDINGS: The bony calvarium is intact. Prior lacunar infarct is noted on the
right in the region of the basal ganglia stable in appearance from
the prior study. No findings to suggest acute hemorrhage, acute
infarction or space-occupying mass lesion are noted.
IMPRESSION: Chronic ischemic change without acute abnormality.

## 2016-02-29 IMAGING — CR DG CHEST 1V PORT
1 series · 1 of 1 positions shown · non-contrast
Comparison: Yesterday

CLINICAL DATA: Shortness of breath.  Possible seizure

EXAM:
PORTABLE CHEST 1 VIEW

[AP]
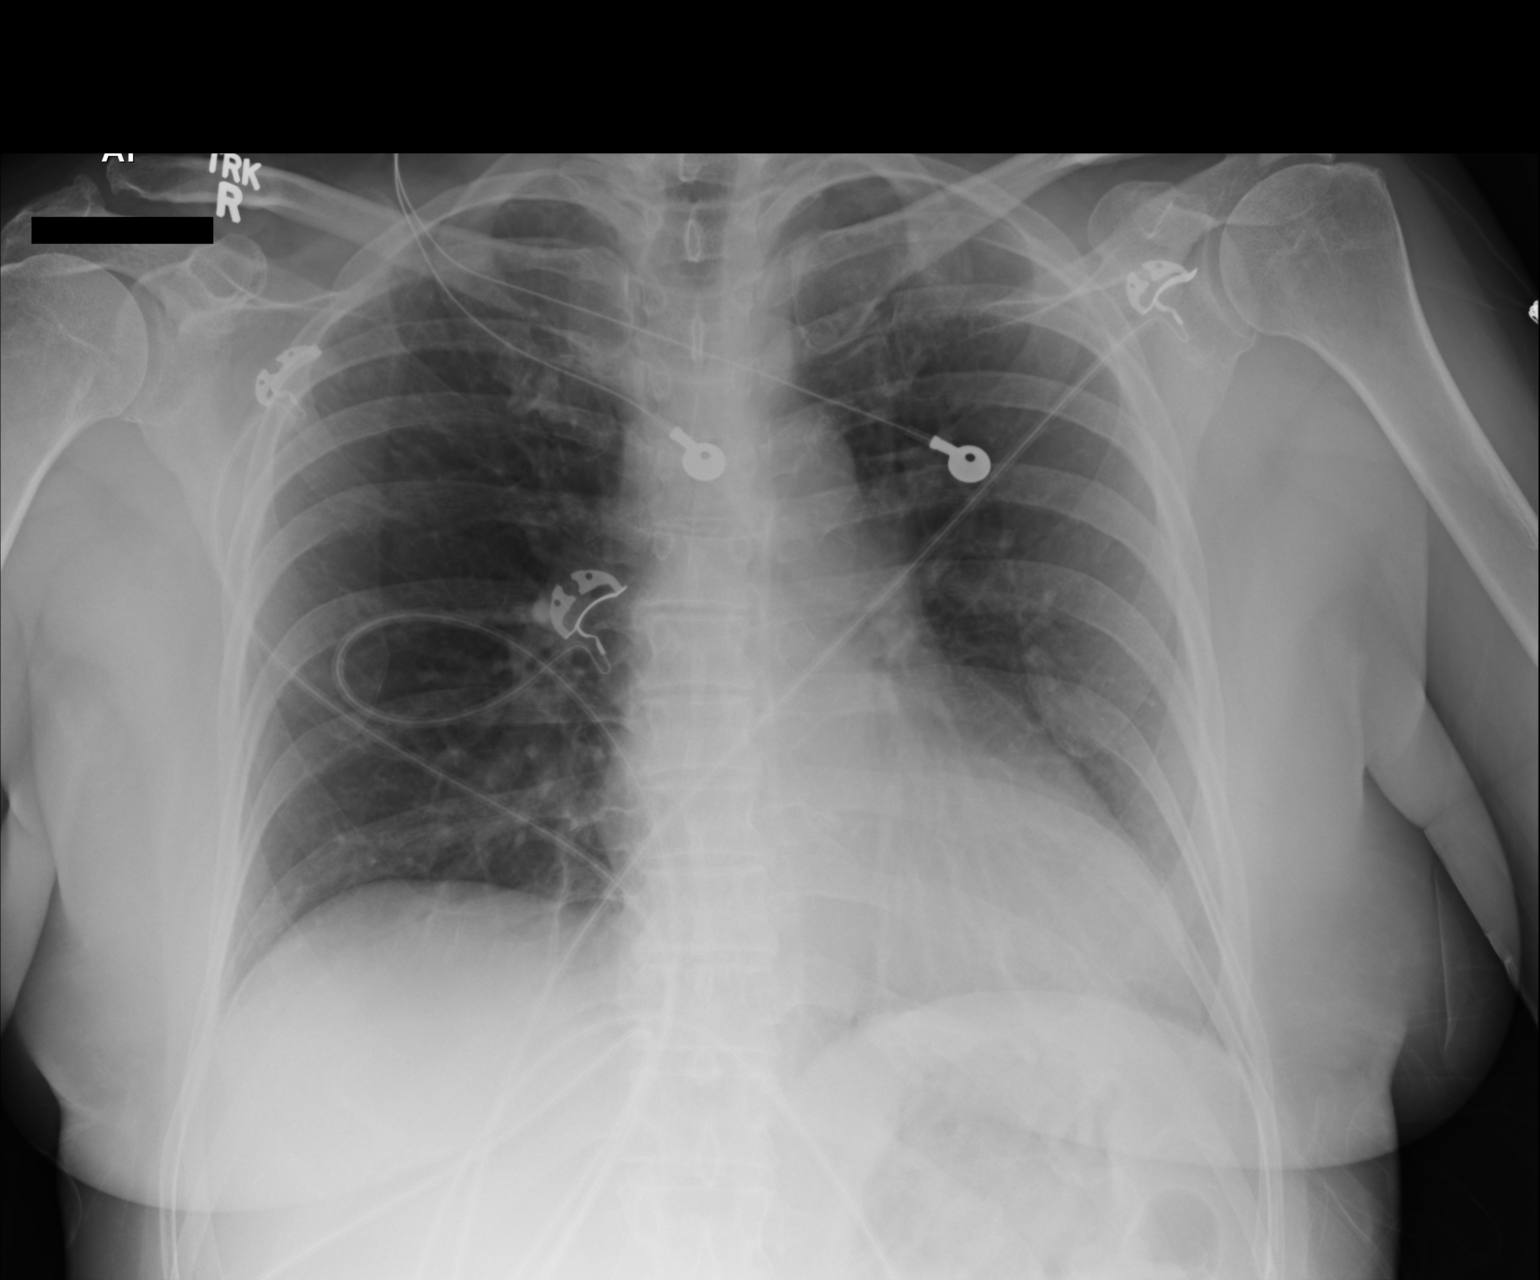

[1 of 1 positions shown; findings below may reference images not displayed]

FINDINGS: Stable borderline cardiomegaly. Negative aortic and hilar contours.
Normalized appearance of the interstitium. There is no edema,
consolidation, effusion, or pneumothorax.
IMPRESSION: No active disease.

## 2016-03-12 ENCOUNTER — Telehealth: Payer: Self-pay | Admitting: Neurology

## 2016-03-12 MED ORDER — LEVETIRACETAM 500 MG PO TABS: 500.0000 mg | ORAL_TABLET | Freq: Two times a day (BID) | ORAL | Status: DC

## 2016-03-12 NOTE — Telephone Encounter (Signed)
Refill sent to pharmacy per pt request. Pt has upcoming apt in April.

## 2016-03-12 NOTE — Telephone Encounter (Signed)
Pt is requesting refill on levETIRAcetam (KEPPRA) 500 MG tablet, generic please. Pt is out of medication

## 2016-04-09 ENCOUNTER — Other Ambulatory Visit: Payer: Self-pay | Admitting: Neurology

## 2016-04-18 ENCOUNTER — Ambulatory Visit (INDEPENDENT_AMBULATORY_CARE_PROVIDER_SITE_OTHER): Payer: Medicare Other | Admitting: Adult Health

## 2016-04-18 ENCOUNTER — Encounter: Payer: Self-pay | Admitting: Adult Health

## 2016-04-18 VITALS — BP 124/72 | HR 74 | Ht 64.0 in | Wt 156.4 lb

## 2016-04-18 DIAGNOSIS — R569 Unspecified convulsions: Secondary | ICD-10-CM

## 2016-04-18 MED ORDER — LEVETIRACETAM 500 MG PO TABS: 500.0000 mg | ORAL_TABLET | Freq: Two times a day (BID) | ORAL | Status: DC

## 2016-04-18 NOTE — Progress Notes (Signed)
I have read the note, and I agree with the clinical assessment and plan.  Deanndra Kirley KEITH   

## 2016-04-18 NOTE — Progress Notes (Signed)
PATIENT: Samantha Wilkinson DOB: 13-Jun-1946  REASON FOR VISIT: follow up- seizures HISTORY FROM: patient  HISTORY OF PRESENT ILLNESS: Samantha Wilkinson is a 69 year old female with a history of seizures. She returns today for follow-up. It is thought that the patient's seizure events was the result of low sodium levels. She was placed on Keppra 500 mg twice a day. She states that she is not had any additional seizure events. She is now operating a motor vehicle without difficulty. She is able to complete all ADLs independently. She does report some dizziness however her primary care has increased her meclizine. She also reports a dry cough due to dry mouth. She states that this is most likely due to allergies. She denies any changes with her mood. She states that she has been under some stress as her husband has had to undergo surgery. She denies any new neurological symptoms. She returns today for an evaluation.  HISTORY 12/20/15 (WILLIS): Samantha Wilkinson is a 70 year old right-handed white female with a history of a stroke event that affected the right brain in October 2015. The patient had recovered relatively well, but in November 2016, she began having episodes of anxiety, and what were thought to be panic attacks. The patient would have spells of increased heart rate, and a sensation of anxiety. The patient went to the hospital on 12/08/2015 after a witnessed seizure event. The patient felt poorly that day, and she elt nauseated. She felt quite dizzy and she herself called 911. The patient tried to get over to the chair, but then apparently lost consciousness. The patient had tongue biting, generalized jerking. Upon admission to the hospital, she was noted to have a low sodium level of 121. The patient had at least 5 or 6 more seizures in the hospital, she had rhabdomyolysis, and acute renal failure associated with this. The patient had diffuse muscle soreness, with an increased white count associated with the seizure.  The patient has had no residual new numbness or weakness of the face, arms, or legs. She was placed on Keppra. A video EEG monitor study was normal. MRI of the brain did not show acute changes, with an chronic right middle cerebral artery distribution stroke and a left occipital stroke were noted. The patient is doing well on the [place], she was told to restrict fluids, take some salt in the diet. She still has some residual dizziness and fatigue issues since the hospitalization, she comes to the office today for an evaluation.   REVIEW OF SYSTEMS: Out of a complete 14 system review of symptoms, the patient complains only of the following symptoms, and all other reviewed systems are negative.  Runny nose, eye itching, light sensitivity, cough, restless leg, frequent waking, joint pain, muscle cramps, moles, memory loss, dizziness, weakness, environmental allergies  ALLERGIES: Allergies  Allergen Reactions  . Codeine     HOME MEDICATIONS: Outpatient Prescriptions Prior to Visit  Medication Sig Dispense Refill  . amLODipine (NORVASC) 5 MG tablet Take 1 tablet (5 mg total) by mouth daily. 30 tablet 0  . aspirin EC 81 MG tablet Take 81 mg by mouth daily.    . cetirizine (ZYRTEC) 10 MG tablet Take 10 mg by mouth daily.    . clopidogrel (PLAVIX) 75 MG tablet Take 75 mg by mouth daily.    . magnesium gluconate (MAGONATE) 500 MG tablet Take 500 mg by mouth daily.    . meclizine (ANTIVERT) 25 MG tablet Take 25 mg by mouth 2 (two) times  daily.     . omeprazole (PRILOSEC) 20 MG capsule Take 20 mg by mouth daily.    Marland Kitchen OVER THE COUNTER MEDICATION Place 1 drop into both eyes daily.    Marland Kitchen levETIRAcetam (KEPPRA) 500 MG tablet TAKE ONE TABLET BY MOUTH TWICE DAILY 60 tablet 0  . sertraline (ZOLOFT) 50 MG tablet Take 50 mg by mouth daily.     No facility-administered medications prior to visit.    PAST MEDICAL HISTORY: Past Medical History  Diagnosis Date  . Hypertension   . Stroke [facility]) october 2015    . Anxiety   . Depression   . Hyperlipidemia     PAST SURGICAL HISTORY: Past Surgical History  Procedure Laterality Date  . Abdominal hysterectomy    . Back surgery    . Locked left shoulder    . Shoulder surgery Right   . Elbow surgery Right   . Knee surgery Right   . Cholecystectomy      FAMILY HISTORY: Family History  Problem Relation Age of Onset  . Seizures Sister   . Hypertension Sister   . Heart attack Mother   . Diabetes Brother   . Kidney failure Brother   . Hypertension Brother   . Heart attack Father     SOCIAL HISTORY: Social History   Social History  . Marital Status: Married    Spouse Name: N/A  . Number of Children: 1  . Years of Education: 13   Occupational History  . retired    Social History Main Topics  . Smoking status: Former Smoker    Quit date: 12/08/1965  . Smokeless tobacco: Never Used  . Alcohol Use: No  . Drug Use: No  . Sexual Activity: Not on file   Other Topics Concern  . Not on file   Social History Narrative   Patient drinks caffeine occasionally.    Patient is right handed.       PHYSICAL EXAM  Filed Vitals:   04/18/16 1110  BP: 124/72  Pulse: 74  Height: 5\' 4"  (1.626 m)  Weight: 156 lb 6.4 oz (70.943 kg)   Body mass index is 26.83 kg/(m^2).  Generalized: Well developed, in no acute distress   Neurological examination  Mentation: Alert oriented to time, place, history taking. Follows all commands speech and language fluent Cranial nerve II-XII: Pupils were equal round reactive to light. Extraocular movements were full, visual field were full on confrontational test. Facial sensation and strength were normal. Uvula tongue midline. Head turning and shoulder shrug  were normal and symmetric. Motor: The motor testing reveals 5 over 5 strength of all 4 extremities. Good symmetric motor tone is noted throughout.  Sensory: Sensory testing is intact to soft touch on all 4 extremities. No evidence of extinction is  noted.  Coordination: Cerebellar testing reveals good finger-nose-finger and heel-to-shin bilaterally.  Gait and station: Gait is normal. Tandem gait is normal. Romberg is negative. No drift is seen.  Reflexes: Deep tendon reflexes are symmetric and normal bilaterally.   DIAGNOSTIC DATA (LABS, IMAGING, TESTING) - I reviewed patient records, labs, notes, testing and imaging myself where available.  Lab Results  Component Value Date   WBC 7.8 12/13/2015   HGB 9.5* 12/13/2015   HCT 28.2* 12/13/2015   MCV 85.2 12/13/2015   PLT 280 12/13/2015      Component Value Date/Time   NA 139 12/13/2015 0520   K 3.2* 12/13/2015 0520   CL 111 12/13/2015 0520   CO2 20* 12/13/2015 0520  GLUCOSE 101* 12/13/2015 0520   BUN 12 12/13/2015 0520   CREATININE 1.02* 12/13/2015 0520   CALCIUM 8.2* 12/13/2015 0520   PROT 5.9* 12/12/2015 0530   ALBUMIN 2.9* 12/12/2015 0530   AST 85* 12/12/2015 0530   ALT 55* 12/12/2015 0530   ALKPHOS 55 12/12/2015 0530   BILITOT 0.7 12/12/2015 0530   GFRNONAA 55* 12/13/2015 0520   GFRAA >60 12/13/2015 0520   Lab Results  Component Value Date   CHOL 146 12/09/2015   HDL 57 12/09/2015   LDLCALC 80 12/09/2015   TRIG 45 12/09/2015   CHOLHDL 2.6 12/09/2015   Lab Results  Component Value Date   HGBA1C 6.1* 12/09/2015   Lab Results  Component Value Date   VITAMINB12 701 12/08/2015   Lab Results  Component Value Date   TSH 2.823 12/08/2015      ASSESSMENT AND PLAN 70 y.o. year old female  has a past medical history of Hypertension; Stroke [facility]) (october 2015); Anxiety; Depression; and Hyperlipidemia. here with:  1. Seizure events  Overall the patient is doing well. She is not had any additional seizures. She will continue on Keppra 500 mg twice a day. Patient advised that if she has any additional event she should let us know. She will follow-up in 6 months or sooner if needed.     Ward Givens, MSN, NP-C 04/18/2016, 11:52 AM [facility] [address] [place], [place] [address] [phone]

## 2016-04-18 NOTE — Patient Instructions (Signed)
Continue Keppra 500 mg twice a day If your symptoms worsen or you develop new symptoms please let us know.   

## 2016-10-18 ENCOUNTER — Ambulatory Visit (INDEPENDENT_AMBULATORY_CARE_PROVIDER_SITE_OTHER): Payer: Medicare Other | Admitting: Adult Health

## 2016-10-18 ENCOUNTER — Encounter: Payer: Self-pay | Admitting: Adult Health

## 2016-10-18 VITALS — BP 125/73 | HR 69 | Ht 64.0 in | Wt 157.0 lb

## 2016-10-18 DIAGNOSIS — R569 Unspecified convulsions: Secondary | ICD-10-CM

## 2016-10-18 NOTE — Progress Notes (Signed)
I have read the note, and I agree with the clinical assessment and plan.  Samantha Wilkinson,Samantha Wilkinson   

## 2016-10-18 NOTE — Patient Instructions (Signed)
Continue Keppra 500 mg BID If your symptoms worsen or you develop new symptoms please let us know.

## 2016-10-18 NOTE — Progress Notes (Addendum)
PATIENT: Samantha Wilkinson DOB: [birth year]  REASON FOR VISIT: follow up-seizures HISTORY FROM: patient  HISTORY OF PRESENT ILLNESS: Samantha Wilkinson is a 70 year old female with a history of seizures. She returns today for follow-up. She continues on Keppra 500 mg twice a day. She denies any seizure events. In the past her seizures were thought to be related to hyponatremia. She denies any changes with her mood or behavior. Denies any changes with her gait or balance. She is able to complete all ADLs independently. She operates a Teacher, music without difficulty. She does occasionally have episodes of dizziness. She states that usually occurs with anxiety. She describes her dizziness as feeling lightheaded. Usually resolves after she sits down for a couple minutes. She denies any new neurological issues. Denies any new medical issues. She returns today for an evaluation.   HISTORY 04/18/16: Samantha Wilkinson is a 70 year old female with a history of seizures. She returns today for follow-up. It is thought that the patient's seizure events was the result of low sodium levels. She was placed on Keppra 500 mg twice a day. She states that she is not had any additional seizure events. She is now operating a motor vehicle without difficulty. She is able to complete all ADLs independently. She does report some dizziness however her primary care has increased her meclizine. She also reports a dry cough due to dry mouth. She states that this is most likely due to allergies. She denies any changes with her mood. She states that she has been under some stress as her husband has had to undergo surgery. She denies any new neurological symptoms. She returns today for an evaluation.  HISTORY 12/20/15 (WILLIS): Samantha Wilkinson is a 70 year old right-handed white female with a history of a stroke event that affected the right brain in October 2015. The patient had recovered relatively well, but in November 2016, she began having episodes of  anxiety, and what were thought to be panic attacks. The patient would have spells of increased heart rate, and a sensation of anxiety. The patient went to the hospital on 12/08/2015 after a witnessed seizure event. The patient felt poorly that day, and she elt nauseated. She felt quite dizzy and she herself called 911. The patient tried to get over to the chair, but then apparently lost consciousness. The patient had tongue biting, generalized jerking. Upon admission to the hospital, she was noted to have a low sodium level of 121. The patient had at least 5 or 6 more seizures in the hospital, she had rhabdomyolysis, and acute renal failure associated with this. The patient had diffuse muscle soreness, with an increased white count associated with the seizure. The patient has had no residual new numbness or weakness of the face, arms, or legs. She was placed on Keppra. A video EEG monitor study was normal. MRI of the brain did not show acute changes, with an chronic right middle cerebral artery distribution stroke and a left occipital stroke were noted. The patient is doing well on the [place], she was told to restrict fluids, take some salt in the diet. She still has some residual dizziness and fatigue issues since the hospitalization, she comes to the office today for an evaluation.   REVIEW OF SYSTEMS: Out of a complete 14 system review of symptoms, the patient complains only of the following symptoms, and all other reviewed systems are negative. See history of present illness   ALLERGIES: Allergies  Allergen Reactions  . Codeine  HOME MEDICATIONS: Outpatient Medications Prior to Visit  Medication Sig Dispense Refill  . amLODipine (NORVASC) 5 MG tablet Take 1 tablet (5 mg total) by mouth daily. 30 tablet 0  . aspirin EC 81 MG tablet Take 81 mg by mouth daily.    . cetirizine (ZYRTEC) 10 MG tablet Take 10 mg by mouth daily.    . clopidogrel (PLAVIX) 75 MG tablet Take 75 mg by mouth daily.      Marland Kitchen levETIRAcetam (KEPPRA) 500 MG tablet Take 1 tablet (500 mg total) by mouth 2 (two) times daily. 60 tablet 11  . omeprazole (PRILOSEC) 20 MG capsule Take 20 mg by mouth daily.    Marland Kitchen OVER THE COUNTER MEDICATION Place 1 drop into both eyes daily.    . meclizine (ANTIVERT) 25 MG tablet Take 25 mg by mouth 2 (two) times daily as needed.     . magnesium gluconate (MAGONATE) 500 MG tablet Take 500 mg by mouth daily.     No facility-administered medications prior to visit.     PAST MEDICAL HISTORY: Past Medical History:  Diagnosis Date  . Anxiety   . Depression   . Hyperlipidemia   . Hypertension   . Stroke [facility]) october 2015    PAST SURGICAL HISTORY: Past Surgical History:  Procedure Laterality Date  . ABDOMINAL HYSTERECTOMY    . BACK SURGERY    . CHOLECYSTECTOMY    . ELBOW SURGERY Right   . KNEE SURGERY Right   . locked left shoulder    . SHOULDER SURGERY Right     FAMILY HISTORY: Family History  Problem Relation Age of Onset  . Seizures Sister   . Hypertension Sister   . Heart attack Mother   . Diabetes Brother   . Kidney failure Brother   . Hypertension Brother   . Heart attack Father     SOCIAL HISTORY: Social History   Social History  . Marital status: Married    Spouse name: N/A  . Number of children: 1  . Years of education: 70   Occupational History  . retired    Social History Main Topics  . Smoking status: Former Smoker    Quit date: 12/08/1965  . Smokeless tobacco: Never Used  . Alcohol use No  . Drug use: No  . Sexual activity: Not on file   Other Topics Concern  . Not on file   Social History Narrative   Patient drinks caffeine occasionally.    Patient is right handed.       PHYSICAL EXAM  Vitals:   10/18/16 1032  BP: 125/73  Pulse: 69  Weight: 157 lb (71.2 kg)  Height: 5\' 4"  (1.626 m)   Body mass index is 26.95 kg/m.  Generalized: Well developed, in no acute distress   Neurological examination  Mentation: Alert oriented  to time, place, history taking. Follows all commands speech and language fluent Cranial nerve II-XII: Pupils were equal round reactive to light. Extraocular movements were full, visual field were full on confrontational test. Facial sensation and strength were normal. Uvula tongue midline. Head turning and shoulder shrug  were normal and symmetric. Motor: The motor testing reveals 5 over 5 strength of all 4 extremities. Good symmetric motor tone is noted throughout.  Sensory: Sensory testing is intact to soft touch on all 4 extremities. No evidence of extinction is noted.  Coordination: Cerebellar testing reveals good finger-nose-finger and heel-to-shin bilaterally.  Gait and station: Gait is normal. Tandem gait is normal. Romberg is negative. No  drift is seen.  Reflexes: Deep tendon reflexes are symmetric and normal bilaterally.   DIAGNOSTIC DATA (LABS, IMAGING, TESTING) - I reviewed patient records, labs, notes, testing and imaging myself where available.  Lab Results  Component Value Date   WBC 7.8 12/13/2015   HGB 9.5 (L) 12/13/2015   HCT 28.2 (L) 12/13/2015   MCV 85.2 12/13/2015   PLT 280 12/13/2015      Component Value Date/Time   NA 139 12/13/2015 0520   K 3.2 (L) 12/13/2015 0520   CL 111 12/13/2015 0520   CO2 20 (L) 12/13/2015 0520   GLUCOSE 101 (H) 12/13/2015 0520   BUN 12 12/13/2015 0520   CREATININE 1.02 (H) 12/13/2015 0520   CALCIUM 8.2 (L) 12/13/2015 0520   PROT 5.9 (L) 12/12/2015 0530   ALBUMIN 2.9 (L) 12/12/2015 0530   AST 85 (H) 12/12/2015 0530   ALT 55 (H) 12/12/2015 0530   ALKPHOS 55 12/12/2015 0530   BILITOT 0.7 12/12/2015 0530   GFRNONAA 55 (L) 12/13/2015 0520   GFRAA >60 12/13/2015 0520   Lab Results  Component Value Date   CHOL 146 12/09/2015   HDL 57 12/09/2015   LDLCALC 80 12/09/2015   TRIG 45 12/09/2015   CHOLHDL 2.6 12/09/2015   Lab Results  Component Value Date   HGBA1C 6.1 (H) 12/09/2015   Lab Results  Component Value Date   VITAMINB12  701 12/08/2015   Lab Results  Component Value Date   TSH 2.823 12/08/2015      ASSESSMENT AND PLAN 70 y.o. year old female  has a past medical history of Anxiety; Depression; Hyperlipidemia; Hypertension; and Stroke [facility]) (october 2015). here with:  1. Seizures  The patient is doing well. She will continue on Keppra 500 mg twice a day. Advised that if she has any seizure event she Should let us know. At the next visit she will discuss with Dr. Jannifer Franklin about potentially weaning off of Keppra. She will follow-up in 6 months or sooner if needed.   Ward Givens, MSN, NP-C 10/18/2016, 10:38 AM [facility] [address], [place], [place] [address] [phone]

## 2017-02-04 ENCOUNTER — Telehealth: Payer: Self-pay | Admitting: Adult Health

## 2017-02-04 NOTE — Telephone Encounter (Signed)
Her PCP should be able to refill plavix? If not let me know

## 2017-02-04 NOTE — Telephone Encounter (Signed)
Pt called says she was seeing Dr [facility] for stroke and [facility] for seizures. She said Dr Metta Clines has retired and moved to [place]. She is wanting clinic to take over refills for plavix. Last appt she had with him was 01/03/16. She is out of the medication as of this morning. Please call FYI, when I asked for a call back number she said my husband is at [facility] with brain surgery, she became teary eyed, she said he is not doing well, gave her cell number.

## 2017-02-04 NOTE — Telephone Encounter (Signed)
LMVM for pt that her pcp should refill this for her, let us know if not.

## 2017-03-29 ENCOUNTER — Telehealth: Payer: Self-pay | Admitting: Neurology

## 2017-03-29 NOTE — Telephone Encounter (Signed)
Patient requesting refill of levETIRAcetam (KEPPRA) 500 MG tablet called to [facility] in [place]. She has appointment with Dr. Jannifer Franklin on 04-11-17.

## 2017-03-29 NOTE — Telephone Encounter (Addendum)
Called and spoke with pt. Advised I called her pharmacy and they said she last filled rx on 3/12 and she has two refills remaining. She verbalized understanding. Nothing further needed at this time.

## 2017-04-11 ENCOUNTER — Encounter: Payer: Self-pay | Admitting: Neurology

## 2017-04-11 ENCOUNTER — Ambulatory Visit: Payer: Medicare Other | Admitting: Neurology

## 2017-04-11 ENCOUNTER — Ambulatory Visit (INDEPENDENT_AMBULATORY_CARE_PROVIDER_SITE_OTHER): Payer: Medicare Other | Admitting: Neurology

## 2017-04-11 VITALS — BP 145/92 | HR 87 | Ht 64.0 in | Wt 158.0 lb

## 2017-04-11 DIAGNOSIS — R569 Unspecified convulsions: Secondary | ICD-10-CM | POA: Diagnosis not present

## 2017-04-11 MED ORDER — LEVETIRACETAM 500 MG PO TABS: 500.0000 mg | ORAL_TABLET | Freq: Two times a day (BID) | ORAL | 3 refills | Status: DC

## 2017-04-11 NOTE — Progress Notes (Signed)
Reason for visit: Seizures  Samantha Wilkinson is an 71 y.o. female  History of present illness:  Samantha Wilkinson is a 71 year old right-handed white female with a history of seizure events. The patient is on Keppra taking 500 mg twice daily. The patient is tolerating the medication quite well, she has not had any recurrence of symptoms, she is operating a motor vehicle. She is under stress as her husband has brain cancer and he is being actively treated for this. The patient claims that she is sleeping well, however. She returns for an evaluation.  Past Medical History:  Diagnosis Date  . Anxiety   . Depression   . Hyperlipidemia   . Hypertension   . Stroke [facility]) october 2015    Past Surgical History:  Procedure Laterality Date  . ABDOMINAL HYSTERECTOMY    . BACK SURGERY    . CHOLECYSTECTOMY    . ELBOW SURGERY Right   . KNEE SURGERY Right   . locked left shoulder    . SHOULDER SURGERY Right     Family History  Problem Relation Age of Onset  . Seizures Sister   . Hypertension Sister   . Heart attack Mother   . Diabetes Brother   . Kidney failure Brother   . Hypertension Brother   . Heart attack Father     Social history:  reports that she quit smoking about 51 years ago. She has never used smokeless tobacco. She reports that she does not drink alcohol or use drugs.    Allergies  Allergen Reactions  . Codeine     Medications:  Prior to Admission medications   Medication Sig Start Date End Date Taking? Authorizing Provider  amLODipine (NORVASC) 5 MG tablet Take 1 tablet (5 mg total) by mouth daily. 12/13/15  Yes Reyne Dumas, MD  aspirin EC 81 MG tablet Take 81 mg by mouth daily.   Yes Historical Provider, MD  cetirizine (ZYRTEC) 10 MG tablet Take 10 mg by mouth daily.   Yes Historical Provider, MD  clopidogrel (PLAVIX) 75 MG tablet Take 75 mg by mouth daily.   Yes Historical Provider, MD  levETIRAcetam (KEPPRA) 500 MG tablet Take 1 tablet (500 mg total) by mouth 2 (two)  times daily. 04/18/16  Yes Ward Givens, NP  omeprazole (PRILOSEC) 20 MG capsule Take 20 mg by mouth daily.   Yes Historical Provider, MD  OVER THE COUNTER MEDICATION Place 1 drop into both eyes daily.   Yes Historical Provider, MD  Tetrahydrozoline HCl (EYE DROPS OP) Apply 1 Dose to eye daily. Refresh classic   Yes Historical Provider, MD    ROS:  Out of a complete 14 system review of symptoms, the patient complains only of the following symptoms, and all other reviewed systems are negative.  Memory loss, headache Depression, anxiety  Blood pressure (!) 145/92, pulse 87, height 5\' 4"  (1.626 m), weight 158 lb (71.7 kg).  Physical Exam  General: The patient is alert and cooperative at the time of the examination.  Skin: No significant peripheral edema is noted.   Neurologic Exam  Mental status: The patient is alert and oriented x 3 at the time of the examination. The patient has apparent normal recent and remote memory, with an apparently normal attention span and concentration ability.   Cranial nerves: Facial symmetry is present. Speech is normal, no aphasia or dysarthria is noted. Extraocular movements are full. Visual fields are full.  Motor: The patient has good strength in all 4 extremities.  Sensory examination: Soft touch sensation is symmetric on the face, arms, and legs.  Coordination: The patient has good finger-nose-finger and heel-to-shin bilaterally.  Gait and station: The patient has a normal gait. Tandem gait is normal. Romberg is negative. No drift is seen.  Reflexes: Deep tendon reflexes are symmetric.   Assessment/Plan:  1. History of seizures, well controlled  The patient is doing well on Keppra, we will continue the medication at this time, a prescription was sent in. She will follow-up in one year, sooner if needed.  Jill Alexanders MD 04/11/2017 8:53 AM  [facility] [address] [place], [place]  [address]  Phone [phone] Fax [phone]

## 2017-04-19 ENCOUNTER — Ambulatory Visit: Payer: Medicare Other | Admitting: Neurology

## 2018-03-31 DIAGNOSIS — Z8673 Personal history of transient ischemic attack (TIA), and cerebral infarction without residual deficits: Secondary | ICD-10-CM

## 2018-03-31 DIAGNOSIS — I679 Cerebrovascular disease, unspecified: Secondary | ICD-10-CM

## 2018-03-31 HISTORY — DX: Personal history of transient ischemic attack (TIA), and cerebral infarction without residual deficits: Z86.73

## 2018-03-31 HISTORY — DX: Cerebrovascular disease, unspecified: I67.9

## 2018-04-14 ENCOUNTER — Ambulatory Visit: Payer: Medicare Other | Admitting: Adult Health

## 2018-04-28 ENCOUNTER — Telehealth: Payer: Self-pay

## 2018-04-28 MED ORDER — EZETIMIBE 10 MG PO TABS: 10.0000 mg | ORAL_TABLET | Freq: Every day | ORAL | 1 refills | Status: DC

## 2018-04-28 NOTE — Telephone Encounter (Signed)
[facility] 74m [facility]  Pt aware.

## 2018-05-12 DIAGNOSIS — E785 Hyperlipidemia, unspecified: Secondary | ICD-10-CM | POA: Insufficient documentation

## 2018-05-12 DIAGNOSIS — I10 Essential (primary) hypertension: Secondary | ICD-10-CM | POA: Insufficient documentation

## 2018-05-12 NOTE — Progress Notes (Signed)
Cardiology Office Note:    Date:  05/13/2018   ID:  Samantha Wilkinson, DOB 10/24/1946, MRN [id number]  PCP:  Street, [place], MD  Cardiologist:  Shirlee More, MD    Referring MD: [address], Sharon Mt, *    ASSESSMENT:    1. Essential hypertension   2. Hyperlipidemia, unspecified hyperlipidemia type    PLAN:    In order of problems listed above:  1. Stable blood pressure target continue current treatment with calcium channel blocker 2. Stable continue Zetia.  Requested a copy of recent labs at her PCP office.   Next appointment: Yes   Medication Adjustments/Labs and Tests Ordered: Current medicines are reviewed at length with the patient today.  Concerns regarding medicines are outlined above.  No orders of the defined types were placed in this encounter.  No orders of the defined types were placed in this encounter.   Chief Complaint  Patient presents with  . Follow-up  . Hypertension  . Hyperlipidemia    History of Present Illness:    Samantha Wilkinson is a 72 y.o. female with a hx of chest pain and a normal MPS July 2017, hypertension Dyslipidemia, Swelling/Edema with a CCB, hyponatremia a left occipital stroke last seen 04/22/17. Compliance with diet, lifestyle and medications: Yes She has had no chest pain shortness of breath palpitation or syncope she had trouble with reflux symptoms and is improved with Zantac.  She continues on dual antiplatelet therapy with history of stroke Past Medical History:  Diagnosis Date  . Acute left arterial ischemic stroke, ICA (internal carotid artery) ([place]) 11/12/2014  . Altered mental status 12/08/2015  . Anxiety   . Aphasia 01/19/2016  . Cerebrovascular disease 03/31/2018  . Convulsions ([place])   . Depression   . Depression with anxiety 12/08/2015  . Encephalopathy 12/08/2015  . History of stroke 03/31/2018  . Hyperlipidemia   . Hypertension   . Hyponatremia 12/08/2015  . Lactic acid acidosis 12/08/2015  . Migraine 11/12/2014  .  Psychogenic polydipsia 12/08/2015  . Seizure ([place]) 01/19/2016  . Stroke [facility]) october 2015    Past Surgical History:  Procedure Laterality Date  . ABDOMINAL HYSTERECTOMY    . BACK SURGERY    . CHOLECYSTECTOMY    . ELBOW SURGERY Right   . KNEE SURGERY Right   . locked left shoulder    . SHOULDER SURGERY Right     Current Medications: Current Meds  Medication Sig  . amLODipine (NORVASC) 5 MG tablet Take 1 tablet (5 mg total) by mouth daily.  Marland Kitchen aspirin EC 81 MG tablet Take 81 mg by mouth daily.  . clopidogrel (PLAVIX) 75 MG tablet Take 75 mg by mouth daily.  . COLLAGEN PO Take 1 tablet by mouth daily.  Marland Kitchen ezetimibe (ZETIA) 10 MG tablet Take 1 tablet (10 mg total) by mouth daily.  . fluticasone (FLONASE) 50 MCG/ACT nasal spray Place 1 spray into both nostrils daily.  . Magnesium 500 MG TABS Take 1 tablet by mouth daily.  . montelukast (SINGULAIR) 10 MG tablet Take 10 mg by mouth daily.  Marland Kitchen OVER THE COUNTER MEDICATION Place 1 drop into both eyes daily.  . pantoprazole (PROTONIX) 40 MG tablet Take 40 mg by mouth daily.  . ranitidine (ZANTAC) 150 MG tablet TAKE ONE TABLET BY MOUTH TWICE DAILY AS NEEDED FOR REFLUX     Allergies:   Codeine   Social History   Socioeconomic History  . Marital status: Married    Spouse name: Not on file  .  Number of children: 1  . Years of education: 37  . Highest education level: Not on file  Occupational History  . Occupation: retired  Scientific laboratory technician  . Financial resource strain: Not on file  . Food insecurity:    Worry: Not on file    Inability: Not on file  . Transportation needs:    Medical: Not on file    Non-medical: Not on file  Tobacco Use  . Smoking status: Former Smoker    Last attempt to quit: 12/08/1965    Years since quitting: 52.4  . Smokeless tobacco: Never Used  Substance and Sexual Activity  . Alcohol use: No    Alcohol/week: 0.0 oz  . Drug use: No  . Sexual activity: Not on file  Lifestyle  . Physical activity:    Days  per week: Not on file    Minutes per session: Not on file  . Stress: Not on file  Relationships  . Social connections:    Talks on phone: Not on file    Gets together: Not on file    Attends religious service: Not on file    Active member of club or organization: Not on file    Attends meetings of clubs or organizations: Not on file    Relationship status: Not on file  Other Topics Concern  . Not on file  Social History Narrative   Patient drinks caffeine occasionally.    Patient is right handed.      Family History: The patient's family history includes Cancer in her mother; Diabetes in her brother; Heart attack in her father and mother; Heart disease in her brother and father; Hypertension in her brother, father, mother, and sister; Kidney failure in her brother; Seizures in her sister. ROS:   Please see the history of present illness.    All other systems reviewed and are negative.  EKGs/Labs/Other Studies Reviewed:    The following studies were reviewed today:  EKG:  EKG ordered today.  The ekg ordered today demonstrates SRTH/normal  Recent Labs: No results found for requested labs within last 8760 hours.  Recent Lipid Panel    Component Value Date/Time   CHOL 146 12/09/2015 0409   TRIG 45 12/09/2015 0409   HDL 57 12/09/2015 0409   CHOLHDL 2.6 12/09/2015 0409   VLDL 9 12/09/2015 0409   LDLCALC 80 12/09/2015 0409    Physical Exam:    VS:  BP 132/82 (BP Location: Right Arm, Patient Position: Sitting, Cuff Size: Normal)   Pulse 72   Ht 5' 3"  (1.6 m)   Wt 150 lb 12.8 oz (68.4 kg)   SpO2 98%   BMI 26.71 kg/m     Wt Readings from Last 3 Encounters:  05/13/18 150 lb 12.8 oz (68.4 kg)  04/11/17 158 lb (71.7 kg)  10/18/16 157 lb (71.2 kg)     GEN:  Well nourished, well developed in no acute distress HEENT: Normal NECK: No JVD; No carotid bruits LYMPHATICS: No lymphadenopathy CARDIAC: RRR, no murmurs, rubs, gallops RESPIRATORY:  Clear to auscultation without  rales, wheezing or rhonchi  ABDOMEN: Soft, non-tender, non-distended MUSCULOSKELETAL:  No edema; No deformity  SKIN: Warm and dry NEUROLOGIC:  Alert and oriented x 3 PSYCHIATRIC:  Normal affect    Signed, Shirlee More, MD  05/13/2018 10:15 AM    [place]

## 2018-05-13 ENCOUNTER — Encounter: Payer: Self-pay | Admitting: Cardiology

## 2018-05-13 ENCOUNTER — Ambulatory Visit: Payer: Medicare Other | Admitting: Cardiology

## 2018-05-13 DIAGNOSIS — I10 Essential (primary) hypertension: Secondary | ICD-10-CM | POA: Diagnosis not present

## 2018-05-13 DIAGNOSIS — E785 Hyperlipidemia, unspecified: Secondary | ICD-10-CM | POA: Diagnosis not present

## 2018-05-13 NOTE — Patient Instructions (Signed)

## 2018-05-23 ENCOUNTER — Encounter: Payer: Self-pay | Admitting: *Deleted

## 2018-05-23 ENCOUNTER — Telehealth: Payer: Self-pay | Admitting: Cardiology

## 2018-05-23 NOTE — Telephone Encounter (Signed)
Please call about cholesterol prescription issues

## 2018-05-23 NOTE — Telephone Encounter (Signed)
Patient had an office visit with Dr. Bettina Gavia on 05/13/18. Dr. Bettina Gavia advised for patient to have lipid panel checked at her PCP appointment on Tuesday, 05/20/18. Patient states her cholesterol has worsening per PCP and they faxed [place] those results for Dr. Bettina Gavia to make changes to cholesterol medication. We have not received those results, so I requested them today from PCP office. Patient confirms she is still taking zetia 10 mg daily. Patient is concerned because of her intolerance to statins. Will have Dr. Bettina Gavia advise.

## 2018-05-28 ENCOUNTER — Telehealth: Payer: Self-pay | Admitting: Cardiology

## 2018-05-28 NOTE — Telephone Encounter (Signed)
I would continue her non statin -zetia

## 2018-05-28 NOTE — Telephone Encounter (Signed)
Please advise. See previous telephone encounter.

## 2018-05-28 NOTE — Telephone Encounter (Signed)
Patient advised to continue Zetia. Patient verbalized understanding. No further questions.

## 2018-05-28 NOTE — Telephone Encounter (Signed)
Please call patient regarding cholesterol medications

## 2018-10-21 ENCOUNTER — Other Ambulatory Visit: Payer: Self-pay | Admitting: Cardiology

## 2018-10-27 ENCOUNTER — Telehealth: Payer: Self-pay | Admitting: Cardiology

## 2018-10-27 MED ORDER — EZETIMIBE 10 MG PO TABS: 10.0000 mg | ORAL_TABLET | Freq: Every day | ORAL | 0 refills | Status: DC

## 2018-10-27 NOTE — Telephone Encounter (Signed)
Patient called and scheduled her 1 year f/u with Dr Bettina Gavia. She will not have enough of her ZETIA to her through to her appointment so she would like for [place] to renew her prescription to get her through

## 2018-10-27 NOTE — Telephone Encounter (Signed)
Refill for zetia sent to [facility] to get her to her follow up with Dr. Bettina Gavia on 12/03/18 at 8:20 am.

## 2018-12-02 NOTE — Progress Notes (Signed)
Cardiology Office Note:    Date:  12/03/2018   ID:  Samantha Wilkinson, DOB [birth year], MRN [id number]  PCP:  Street, Sharon Mt, MD  Cardiologist:  Shirlee More, MD    Referring MD: [address] Sharon Mt, *    ASSESSMENT:    1. Essential hypertension   2. Hyperlipidemia, unspecified hyperlipidemia type   3. Hyponatremia    PLAN:    In order of problems listed above:  1. Stable blood pressure target continue current treatment calcium channel blocker and avoid diuretic with her previous hyponatremia 2. Her LDL is above target and Zetia in the past she is statin intolerant but agrees to try a low dose of a low intensity statin 1 to 2 days a week which often gives a good clinical response combined with Zetia and follow-up labs to conclude liver function lipid profile in 1 month 3. Recheck renal function including serum sodium   Next appointment: 1 year   Medication Adjustments/Labs and Tests Ordered: Current medicines are reviewed at length with the patient today.  Concerns regarding medicines are outlined above.  No orders of the defined types were placed in this encounter.  No orders of the defined types were placed in this encounter.   Chief Complaint  Patient presents with  . Follow-up  . Coronary Artery Disease  . Hypertension  . Hyperlipidemia    History of Present Illness:    Samantha Wilkinson is a 72 y.o. female with a hx of  chest pain and a normal MPS July 2017, hypertension Dyslipidemia, Swelling/Edema with a CCB, hyponatremia a left occipital stroke  last seen 09/13/18. Compliance with diet, lifestyle and medications: Yes  Overall is done well but her life is increasingly stressful with her husband's chronic illness.  She reviewed her previous statin intolerance but agrees to try a low-dose of a low intensity statin 1 to 2 days a week.  She tolerates Zetia.  She has some visual residual from her stroke but she has had no TIA shortness of breath palpitation or chest  pain.  She continues dual antiplatelet therapy without GI side effects Past Medical History:  Diagnosis Date  . Acute left arterial ischemic stroke, ICA (internal carotid artery) ([place]) 11/12/2014  . Altered mental status 12/08/2015  . Anxiety   . Aphasia 01/19/2016  . Cerebrovascular disease 03/31/2018  . Convulsions ([place])   . Depression   . Depression with anxiety 12/08/2015  . Encephalopathy 12/08/2015  . History of stroke 03/31/2018  . Hyperlipidemia   . Hypertension   . Hyponatremia 12/08/2015  . Lactic acid acidosis 12/08/2015  . Migraine 11/12/2014  . Psychogenic polydipsia 12/08/2015  . Seizure ([place]) 01/19/2016  . Stroke [facility]) october 2015  . Ulcerative colitis [facility])     Past Surgical History:  Procedure Laterality Date  . ABDOMINAL HYSTERECTOMY    . BACK SURGERY    . CHOLECYSTECTOMY    . ELBOW SURGERY Right   . KNEE SURGERY Right   . locked left shoulder    . SHOULDER SURGERY Right     Current Medications: Current Meds  Medication Sig  . amLODipine (NORVASC) 5 MG tablet Take 1 tablet (5 mg total) by mouth daily.  Marland Kitchen aspirin EC 81 MG tablet Take 81 mg by mouth daily.  . butalbital-acetaminophen-caffeine (FIORICET WITH CODEINE) 50-325-40-30 MG capsule Take 1 capsule by mouth every 6 (six) hours as needed for migraine.  . Cetirizine HCl (ZYRTEC ALLERGY PO) Take 25 mg by mouth daily.  . clopidogrel (PLAVIX)  75 MG tablet Take 75 mg by mouth daily.  Marland Kitchen ezetimibe (ZETIA) 10 MG tablet Take 1 tablet (10 mg total) by mouth daily.  . Magnesium 500 MG TABS Take 1 tablet by mouth daily.  Marland Kitchen OVER THE COUNTER MEDICATION Place 1 drop into both eyes daily.  . pantoprazole (PROTONIX) 40 MG tablet Take 40 mg by mouth 2 (two) times daily.   . ranitidine (ZANTAC) 150 MG tablet TAKE ONE TABLET BY MOUTH TWICE DAILY AS NEEDED FOR REFLUX     Allergies:   Codeine   Social History   Socioeconomic History  . Marital status: Married    Spouse name: Not on file  . Number of children: 1  . Years  of education: 64  . Highest education level: Not on file  Occupational History  . Occupation: retired  Scientific laboratory technician  . Financial resource strain: Not on file  . Food insecurity:    Worry: Not on file    Inability: Not on file  . Transportation needs:    Medical: Not on file    Non-medical: Not on file  Tobacco Use  . Smoking status: Former Smoker    Last attempt to quit: 12/08/1965    Years since quitting: 53.0  . Smokeless tobacco: Never Used  Substance and Sexual Activity  . Alcohol use: No    Alcohol/week: 0.0 standard drinks  . Drug use: No  . Sexual activity: Not on file  Lifestyle  . Physical activity:    Days per week: Not on file    Minutes per session: Not on file  . Stress: Not on file  Relationships  . Social connections:    Talks on phone: Not on file    Gets together: Not on file    Attends religious service: Not on file    Active member of club or organization: Not on file    Attends meetings of clubs or organizations: Not on file    Relationship status: Not on file  Other Topics Concern  . Not on file  Social History Narrative   Patient drinks caffeine occasionally.    Patient is right handed.      Family History: The patient's family history includes Cancer in her mother; Diabetes in her brother; Heart attack in her father and mother; Heart disease in her brother and father; Hypertension in her brother, father, mother, and sister; Kidney failure in her brother; Seizures in her sister. ROS:   Please see the history of present illness.    All other systems reviewed and are negative.  EKGs/Labs/Other Studies Reviewed:    The following studies were reviewed today:  EKG:  EKG ordered today.  The ekg ordered today demonstrates sinus rhythm and remains normal  Recent Labs: No results found for requested labs within last 8760 hours.  Recent Lipid Panel  Chol 209 HDL 57    Component Value Date/Time   CHOL 146 12/09/2015 0409   TRIG 45 12/09/2015 0409     HDL 57 12/09/2015 0409   CHOLHDL 2.6 12/09/2015 0409   VLDL 9 12/09/2015 0409   LDLCALC 80 12/09/2015 0409    Physical Exam:    VS:  BP (!) 148/84 (BP Location: Left Arm, Patient Position: Sitting, Cuff Size: Normal)   Pulse 75   Ht 5' 3"  (1.6 m)   Wt 151 lb (68.5 kg)   SpO2 98%   BMI 26.75 kg/m     Wt Readings from Last 3 Encounters:  12/03/18 151 lb (68.5  kg)  05/13/18 150 lb 12.8 oz (68.4 kg)  04/11/17 158 lb (71.7 kg)     GEN:  Well nourished, well developed in no acute distress HEENT: Normal NECK: No JVD; No carotid bruits LYMPHATICS: No lymphadenopathy CARDIAC: RRR, no murmurs, rubs, gallops RESPIRATORY:  Clear to auscultation without rales, wheezing or rhonchi  ABDOMEN: Soft, non-tender, non-distended MUSCULOSKELETAL:  No edema; No deformity  SKIN: Warm and dry NEUROLOGIC:  Alert and oriented x 3 PSYCHIATRIC:  Normal affect    Signed, Shirlee More, MD  12/03/2018 8:40 AM    [facility]

## 2018-12-03 ENCOUNTER — Ambulatory Visit: Payer: Medicare Other | Admitting: Cardiology

## 2018-12-03 ENCOUNTER — Encounter: Payer: Self-pay | Admitting: Cardiology

## 2018-12-03 VITALS — BP 148/84 | HR 75 | Ht 63.0 in | Wt 151.0 lb

## 2018-12-03 DIAGNOSIS — I10 Essential (primary) hypertension: Secondary | ICD-10-CM | POA: Diagnosis not present

## 2018-12-03 DIAGNOSIS — E785 Hyperlipidemia, unspecified: Secondary | ICD-10-CM

## 2018-12-03 DIAGNOSIS — E871 Hypo-osmolality and hyponatremia: Secondary | ICD-10-CM | POA: Diagnosis not present

## 2018-12-03 MED ORDER — EZETIMIBE 10 MG PO TABS: 10.0000 mg | ORAL_TABLET | Freq: Every day | ORAL | 3 refills | Status: DC

## 2018-12-03 MED ORDER — PRAVASTATIN SODIUM 20 MG PO TABS: ORAL_TABLET | ORAL | 3 refills | Status: DC

## 2018-12-03 NOTE — Patient Instructions (Signed)
Medication Instructions:  Your physician has recommended you make the following change in your medication:  START: pravastatin 20mg  take one 1 tablet on Mon and Fri  If you need a refill on your cardiac medications before your next appointment, please call your pharmacy.   Lab work: You should return for lab work in 1 month:  CMP and Lipid If you have labs (blood work) drawn today and your tests are completely normal, you will receive your results only by: Marland Kitchen MyChart Message (if you have MyChart) OR . A paper copy in the mail If you have any lab test that is abnormal or we need to change your treatment, we will call you to review the results.  Testing/Procedures: You had an EKG today.  Follow-Up: At [facility], you and your health needs are our priority.  As part of our continuing mission to provide you with exceptional heart care, we have created designated Provider Care Teams.  These Care Teams include your primary Cardiologist (physician) and Advanced Practice Providers (APPs -  Physician Assistants and Nurse Practitioners) who all work together to provide you with the care you need, when you need it. You will need a follow up appointment in 1 years.  Please call our office 2 months in advance to schedule this appointment.       Pravastatin tablets What is this medicine? PRAVASTATIN (PRA va stat in) is known as a HMG-CoA reductase inhibitor or 'statin'. It lowers the level of cholesterol and triglycerides in the blood. This drug may also reduce the risk of heart attack, stroke, or other health problems in patients with risk factors for heart disease. Diet and lifestyle changes are often used with this drug. This medicine may be used for other purposes; ask your health care provider or pharmacist if you have questions. COMMON BRAND NAME(S): Pravachol What should I tell my health care provider before I take this medicine? They need to know if you have any of these  conditions: -frequently drink alcoholic beverages -kidney disease -liver disease -muscle aches or weakness -other medical condition -an unusual or allergic reaction to pravastatin, other medicines, foods, dyes, or preservatives -pregnant or trying to get pregnant -breast-feeding How should I use this medicine? Take pravastatin tablets by mouth. Swallow the tablets with a drink of water. Pravastatin can be taken at anytime of the day, with or without food. Follow the directions on the prescription label. Take your doses at regular intervals. Do not take your medicine more often than directed. Talk to your pediatrician regarding the use of this medicine in children. Special care may be needed. Pravastatin has been used in children as young as 62 years of age. Overdosage: If you think you have taken too much of this medicine contact a poison control center or emergency room at once. NOTE: This medicine is only for you. Do not share this medicine with others. What if I miss a dose? If you miss a dose, take it as soon as you can. If it is almost time for your next dose, take only that dose. Do not take double or extra doses. What may interact with this medicine? This medicine may interact with the following medications: -colchicine -cyclosporine -other medicines for high cholesterol -some antibiotics like azithromycin, clarithromycin, erythromycin, and telithromycin This list may not describe all possible interactions. Give your health care provider a list of all the medicines, herbs, non-prescription drugs, or dietary supplements you use. Also tell them if you smoke, drink alcohol, or use  illegal drugs. Some items may interact with your medicine. What should I watch for while using this medicine? Visit your doctor or health care professional for regular check-ups. You may need regular tests to make sure your liver is working properly. Tell your doctor or health care professional right away if you  get any unexplained muscle pain, tenderness, or weakness, especially if you also have a fever and tiredness. Your doctor or health care professional may tell you to stop taking this medicine if you develop muscle problems. If your muscle problems do not go away after stopping this medicine, contact your health care professional. This drug is only part of a total heart-health program. Your doctor or a dietician can suggest a low-cholesterol and low-fat diet to help. Avoid alcohol and smoking, and keep a proper exercise schedule. Do not use this drug if you are pregnant or breast-feeding. Serious side effects to an unborn child or to an infant are possible. Talk to your doctor or pharmacist for more information. This medicine may affect blood sugar levels. If you have diabetes, check with your doctor or health care professional before you change your diet or the dose of your diabetic medicine. If you are going to have surgery tell your health care professional that you are taking this drug. What side effects may I notice from receiving this medicine? Side effects that you should report to your doctor or health care professional as soon as possible: -allergic reactions like skin rash, itching or hives, swelling of the face, lips, or tongue -dark urine -fever -muscle pain, cramps, or weakness -redness, blistering, peeling or loosening of the skin, including inside the mouth -trouble passing urine or change in the amount of urine -unusually weak or tired -yellowing of the eyes or skin Side effects that usually do not require medical attention (report to your doctor or health care professional if they continue or are bothersome): -gas -headache -heartburn -indigestion -stomach pain This list may not describe all possible side effects. Call your doctor for medical advice about side effects. You may report side effects to FDA at 1-800-FDA-1088. Where should I keep my medicine? Keep out of the reach of  children. Store at room temperature between 15 to 30 degrees C (59 to 86 degrees F). Protect from light. Keep container tightly closed. Throw away any unused medicine after the expiration date. NOTE: This sheet is a summary. It may not cover all possible information. If you have questions about this medicine, talk to your doctor, pharmacist, or health care provider.  2018 Elsevier/Gold Standard (2015-07-14 16:00:27)

## 2019-01-05 LAB — LIPID PANEL
CHOL/HDL RATIO: 3.8 ratio (ref 0.0–4.4)
Cholesterol, Total: 199 mg/dL (ref 100–199)
HDL: 52 mg/dL (ref >39)
LDL Calculated: 117 mg/dL — ABNORMAL HIGH (ref 0–99)
TRIGLYCERIDES: 151 mg/dL — AB (ref 0–149)
VLDL Cholesterol Cal: 30 mg/dL (ref 5–40)

## 2019-01-05 LAB — COMPREHENSIVE METABOLIC PANEL
ALT: 49 IU/L — AB (ref 0–32)
AST: 36 IU/L (ref 0–40)
Albumin/Globulin Ratio: 2.2 (ref 1.2–2.2)
Albumin: 4.6 g/dL (ref 3.5–4.8)
Alkaline Phosphatase: 83 IU/L (ref 39–117)
BILIRUBIN TOTAL: 0.3 mg/dL (ref 0.0–1.2)
BUN/Creatinine Ratio: 18 (ref 12–28)
BUN: 14 mg/dL (ref 8–27)
CHLORIDE: 103 mmol/L (ref 96–106)
CO2: 22 mmol/L (ref 20–29)
Calcium: 9.2 mg/dL (ref 8.7–10.3)
Creatinine, Ser: 0.78 mg/dL (ref 0.57–1.00)
GFR calc Af Amer: 88 mL/min/{1.73_m2} (ref >59)
GFR, EST NON AFRICAN AMERICAN: 77 mL/min/{1.73_m2} (ref >59)
GLOBULIN, TOTAL: 2.1 g/dL (ref 1.5–4.5)
GLUCOSE: 92 mg/dL (ref 65–99)
POTASSIUM: 4.1 mmol/L (ref 3.5–5.2)
SODIUM: 142 mmol/L (ref 134–144)
Total Protein: 6.7 g/dL (ref 6.0–8.5)

## 2019-06-04 ENCOUNTER — Ambulatory Visit: Payer: Medicare Other | Admitting: Cardiology

## 2019-11-23 ENCOUNTER — Ambulatory Visit: Payer: Medicare Other | Admitting: Cardiology

## 2019-12-07 ENCOUNTER — Other Ambulatory Visit: Payer: Self-pay | Admitting: Cardiology

## 2019-12-16 ENCOUNTER — Other Ambulatory Visit: Payer: Self-pay | Admitting: Cardiology

## 2019-12-30 ENCOUNTER — Other Ambulatory Visit: Payer: Self-pay | Admitting: *Deleted

## 2020-01-04 NOTE — Progress Notes (Signed)
Cardiology Office Note:    Date:  01/05/2020   ID:  Samantha Wilkinson, DOB 02-20-1946, MRN [id number]  PCP:  Street, Sharon Mt, MD  Cardiologist:  Shirlee More, MD    Referring MD: [address] Sharon Mt, *    ASSESSMENT:    1. Essential hypertension   2. Hyperlipidemia, unspecified hyperlipidemia type   3. Cerebrovascular disease    PLAN:    In order of problems listed above:  1. Stable BP controlled continue current treatment some channel blocker 2. Stable she tolerates low-dose intermittent pravastatin and Zetia recheck labs and may require additional triglyceride treatment 3. Labile no clinical recurrence follow-up [facility] neurology   Next appointment: 1 year   Medication Adjustments/Labs and Tests Ordered: Current medicines are reviewed at length with the patient today.  Concerns regarding medicines are outlined above.  No orders of the defined types were placed in this encounter.  No orders of the defined types were placed in this encounter.   Chief Complaint  Patient presents with  . Hypertension  . Follow-up  . Hyperlipidemia    History of Present Illness:    Samantha Wilkinson is a 74 y.o. female with a hx of  chest pain and a normal MPS July 2017, hypertension Dyslipidemia, Swelling/Edema with a CCB, hyponatremia a left occipital stroke  last seen 12/03/2018. Compliance with diet, lifestyle and medications: Yes She had a good response with pravastatin and Zetia she had a significant improvement last LDL was 91 check fasting labs today.  If triglycerides remain elevated highly purified EPA vascepa is appropriate with her previous stroke.  She has had no TIA palpitations syncope chest pain edema shortness of breath.  She has had no muscle pain or weakness from lipid-lowering treatment. Past Medical History:  Diagnosis Date  . Acute left arterial ischemic stroke, ICA (internal carotid artery) ([place]) 11/12/2014  . Altered mental status 12/08/2015  . Anxiety    . Aphasia 01/19/2016  . Cerebrovascular disease 03/31/2018  . Convulsions ([place])   . Depression   . Depression with anxiety 12/08/2015  . Encephalopathy 12/08/2015  . History of stroke 03/31/2018  . Hyperlipidemia   . Hypertension   . Hyponatremia 12/08/2015  . Lactic acid acidosis 12/08/2015  . Migraine 11/12/2014  . Psychogenic polydipsia 12/08/2015  . Seizure ([place]) 01/19/2016  . Stroke [facility]) october 2015  . Ulcerative colitis [facility])     Past Surgical History:  Procedure Laterality Date  . ABDOMINAL HYSTERECTOMY    . BACK SURGERY    . CHOLECYSTECTOMY    . ELBOW SURGERY Right   . KNEE SURGERY Right   . locked left shoulder    . SHOULDER SURGERY Right     Current Medications: Current Meds  Medication Sig  . amLODipine (NORVASC) 5 MG tablet Take 1 tablet (5 mg total) by mouth daily.  . APPLE CIDER VINEGAR PO Take by mouth.  Marland Kitchen aspirin EC 81 MG tablet Take 81 mg by mouth daily.  . butalbital-acetaminophen-caffeine (FIORICET WITH CODEINE) 50-325-40-30 MG capsule Take 1 capsule by mouth every 6 (six) hours as needed for migraine.  . Cetirizine HCl (ZYRTEC ALLERGY PO) Take 25 mg by mouth daily.  . clopidogrel (PLAVIX) 75 MG tablet Take 75 mg by mouth daily.  Marland Kitchen ezetimibe (ZETIA) 10 MG tablet TAKE ONE TABLET BY MOUTH DAILY  . LORazepam (ATIVAN) 1 MG tablet Take 1 mg by mouth 2 (two) times daily as needed.  Marland Kitchen MAGNESIUM PO Take 400 mg by mouth.  Marland Kitchen  OVER THE COUNTER MEDICATION Place 1 drop into both eyes daily.  . pantoprazole (PROTONIX) 40 MG tablet Take 40 mg by mouth 2 (two) times daily.   . pravastatin (PRAVACHOL) 20 MG tablet TAKE ONE TABLET BY MOUTH ON MONDAY AND FRIDAY  . [DISCONTINUED] Magnesium 500 MG TABS Take 1 tablet by mouth daily.     Allergies:   Codeine   Social History   Socioeconomic History  . Marital status: Married    Spouse name: Not on file  . Number of children: 1  . Years of education: 13  . Highest education level: Not on file  Occupational History  .  Occupation: retired  Tobacco Use  . Smoking status: Former Smoker    Quit date: 12/08/1965    Years since quitting: 54.1  . Smokeless tobacco: Never Used  Substance and Sexual Activity  . Alcohol use: No    Alcohol/week: 0.0 standard drinks  . Drug use: No  . Sexual activity: Not on file  Other Topics Concern  . Not on file  Social History Narrative   Patient drinks caffeine occasionally.    Patient is right handed.    Social Determinants of Health   Financial Resource Strain:   . Difficulty of Paying Living Expenses: Not on file  Food Insecurity:   . Worried About Charity fundraiser in the Last Year: Not on file  . Ran Out of Food in the Last Year: Not on file  Transportation Needs:   . Lack of Transportation (Medical): Not on file  . Lack of Transportation (Non-Medical): Not on file  Physical Activity:   . Days of Exercise per Week: Not on file  . Minutes of Exercise per Session: Not on file  Stress:   . Feeling of Stress : Not on file  Social Connections:   . Frequency of Communication with Friends and Family: Not on file  . Frequency of Social Gatherings with Friends and Family: Not on file  . Attends Religious Services: Not on file  . Active Member of Clubs or Organizations: Not on file  . Attends Archivist Meetings: Not on file  . Marital Status: Not on file     Family History: The patient's family history includes Cancer in her mother; Diabetes in her brother; Heart attack in her father and mother; Heart disease in her brother and father; Hypertension in her brother, father, mother, and sister; Kidney failure in her brother; Seizures in her sister. ROS:   Please see the history of present illness.    All other systems reviewed and are negative.  EKGs/Labs/Other Studies Reviewed:    The following studies were reviewed today:  EKG:  EKG ordered today and personally reviewed.  The ekg ordered today demonstrates sinus rhythm and is normal  Recent  Labs: No results found for requested labs within last 8760 hours.  Recent Lipid Panel    Component Value Date/Time   CHOL 199 01/05/2019 0843   TRIG 151 (H) 01/05/2019 0843   HDL 52 01/05/2019 0843   CHOLHDL 3.8 01/05/2019 0843   CHOLHDL 2.6 12/09/2015 0409   VLDL 9 12/09/2015 0409   LDLCALC 117 (H) 01/05/2019 0843    Physical Exam:    VS:  BP 130/60   Pulse 92   Ht 5' 3"  (1.6 m)   Wt 152 lb (68.9 kg)   SpO2 96%   BMI 26.93 kg/m     Wt Readings from Last 3 Encounters:  01/05/20 152 lb (68.9  kg)  12/03/18 151 lb (68.5 kg)  05/13/18 150 lb 12.8 oz (68.4 kg)     GEN:  Well nourished, well developed in no acute distress HEENT: Normal NECK: No JVD; No carotid bruits LYMPHATICS: No lymphadenopathy CARDIAC: RRR, no murmurs, rubs, gallops RESPIRATORY:  Clear to auscultation without rales, wheezing or rhonchi  ABDOMEN: Soft, non-tender, non-distended MUSCULOSKELETAL:  No edema; No deformity  SKIN: Warm and dry NEUROLOGIC:  Alert and oriented x 3 PSYCHIATRIC:  Normal affect    Signed, Shirlee More, MD  01/05/2020 8:53 AM    [place]

## 2020-01-05 ENCOUNTER — Ambulatory Visit (INDEPENDENT_AMBULATORY_CARE_PROVIDER_SITE_OTHER): Payer: Medicare PPO | Admitting: Cardiology

## 2020-01-05 ENCOUNTER — Encounter: Payer: Self-pay | Admitting: Cardiology

## 2020-01-05 ENCOUNTER — Other Ambulatory Visit: Payer: Self-pay

## 2020-01-05 VITALS — BP 130/60 | HR 92 | Ht 63.0 in | Wt 152.0 lb

## 2020-01-05 DIAGNOSIS — E785 Hyperlipidemia, unspecified: Secondary | ICD-10-CM | POA: Diagnosis not present

## 2020-01-05 DIAGNOSIS — I679 Cerebrovascular disease, unspecified: Secondary | ICD-10-CM

## 2020-01-05 DIAGNOSIS — I10 Essential (primary) hypertension: Secondary | ICD-10-CM

## 2020-01-05 LAB — COMPREHENSIVE METABOLIC PANEL
ALT: 59 IU/L — ABNORMAL HIGH (ref 0–32)
AST: 45 IU/L — ABNORMAL HIGH (ref 0–40)
Albumin/Globulin Ratio: 1.7 (ref 1.2–2.2)
Albumin: 4.9 g/dL — ABNORMAL HIGH (ref 3.7–4.7)
Alkaline Phosphatase: 76 IU/L (ref 39–117)
BUN/Creatinine Ratio: 20 (ref 12–28)
BUN: 14 mg/dL (ref 8–27)
Bilirubin Total: 0.4 mg/dL (ref 0.0–1.2)
CO2: 23 mmol/L (ref 20–29)
Calcium: 9.5 mg/dL (ref 8.7–10.3)
Chloride: 102 mmol/L (ref 96–106)
Creatinine, Ser: 0.71 mg/dL (ref 0.57–1.00)
GFR calc Af Amer: 98 mL/min/{1.73_m2} (ref >59)
GFR calc non Af Amer: 85 mL/min/{1.73_m2} (ref >59)
Globulin, Total: 2.9 g/dL (ref 1.5–4.5)
Glucose: 93 mg/dL (ref 65–99)
Potassium: 4.2 mmol/L (ref 3.5–5.2)
Sodium: 139 mmol/L (ref 134–144)
Total Protein: 7.8 g/dL (ref 6.0–8.5)

## 2020-01-05 LAB — LIPID PANEL
Chol/HDL Ratio: 3.7 ratio (ref 0.0–4.4)
Cholesterol, Total: 194 mg/dL (ref 100–199)
HDL: 53 mg/dL (ref >39)
LDL Chol Calc (NIH): 115 mg/dL — ABNORMAL HIGH (ref 0–99)
Triglycerides: 149 mg/dL (ref 0–149)
VLDL Cholesterol Cal: 26 mg/dL (ref 5–40)

## 2020-01-05 MED ORDER — PRAVASTATIN SODIUM 20 MG PO TABS: ORAL_TABLET | ORAL | 3 refills | Status: DC

## 2020-01-05 MED ORDER — EZETIMIBE 10 MG PO TABS: 10.0000 mg | ORAL_TABLET | Freq: Every day | ORAL | 3 refills | Status: DC

## 2020-01-05 MED ORDER — CLOPIDOGREL BISULFATE 75 MG PO TABS: 75.0000 mg | ORAL_TABLET | Freq: Every day | ORAL | 3 refills | Status: DC

## 2020-01-05 MED ORDER — AMLODIPINE BESYLATE 5 MG PO TABS: 5.0000 mg | ORAL_TABLET | Freq: Every day | ORAL | 3 refills | Status: DC

## 2020-01-05 NOTE — Patient Instructions (Signed)
Medication Instructions:  Your physician recommends that you continue on your current medications as directed. Please refer to the Current Medication list given to you today.  *If you need a refill on your cardiac medications before your next appointment, please call your pharmacy*  Lab Work: [place]   If you have labs (blood work) drawn today and your tests are completely normal, you will receive your results only by: Marland Kitchen MyChart Message (if you have MyChart) OR . A paper copy in the mail If you have any lab test that is abnormal or we need to change your treatment, we will call you to review the results.  Testing/Procedures: None ordered   Follow-Up: At [facility], you and your health needs are our priority.  As part of our continuing mission to provide you with exceptional heart care, we have created designated Provider Care Teams.  These Care Teams include your primary Cardiologist (physician) and Advanced Practice Providers (APPs -  Physician Assistants and Nurse Practitioners) who all work together to provide you with the care you need, when you need it.  Your next appointment:   12 month(s)  The format for your next appointment:   In Person  Provider:   Shirlee More, MD  Other Instructions None

## 2020-01-06 ENCOUNTER — Telehealth: Payer: Self-pay | Admitting: Emergency Medicine

## 2020-01-06 NOTE — Telephone Encounter (Signed)
Left message for patient to return call regarding results  

## 2020-01-06 NOTE — Telephone Encounter (Signed)
Patient returned call and Hayley was unavailable, Please call patient back with results.

## 2020-01-07 ENCOUNTER — Telehealth: Payer: Self-pay

## 2020-01-07 DIAGNOSIS — E785 Hyperlipidemia, unspecified: Secondary | ICD-10-CM

## 2020-01-07 MED ORDER — EZETIMIBE 10 MG PO TABS: 10.0000 mg | ORAL_TABLET | Freq: Every day | ORAL | 3 refills | Status: DC

## 2020-01-07 NOTE — Telephone Encounter (Signed)
Called pt with results for labs and informed her to keep taking zetia 10 mg with her statin medication and to return within 6 weeks to get labs redrawn. Pt verbalized understanding.

## 2020-01-11 DIAGNOSIS — L02611 Cutaneous abscess of right foot: Secondary | ICD-10-CM | POA: Diagnosis not present

## 2020-01-11 DIAGNOSIS — L600 Ingrowing nail: Secondary | ICD-10-CM | POA: Diagnosis not present

## 2020-01-25 DIAGNOSIS — L02611 Cutaneous abscess of right foot: Secondary | ICD-10-CM | POA: Diagnosis not present

## 2020-03-01 DIAGNOSIS — I679 Cerebrovascular disease, unspecified: Secondary | ICD-10-CM | POA: Diagnosis not present

## 2020-03-01 DIAGNOSIS — Z8673 Personal history of transient ischemic attack (TIA), and cerebral infarction without residual deficits: Secondary | ICD-10-CM | POA: Diagnosis not present

## 2020-05-23 ENCOUNTER — Other Ambulatory Visit: Payer: Self-pay | Admitting: Cardiology

## 2020-08-23 DIAGNOSIS — M5030 Other cervical disc degeneration, unspecified cervical region: Secondary | ICD-10-CM | POA: Diagnosis not present

## 2020-08-23 DIAGNOSIS — M9901 Segmental and somatic dysfunction of cervical region: Secondary | ICD-10-CM | POA: Diagnosis not present

## 2020-08-24 DIAGNOSIS — M9901 Segmental and somatic dysfunction of cervical region: Secondary | ICD-10-CM | POA: Diagnosis not present

## 2020-08-24 DIAGNOSIS — M5030 Other cervical disc degeneration, unspecified cervical region: Secondary | ICD-10-CM | POA: Diagnosis not present

## 2020-08-25 DIAGNOSIS — M5030 Other cervical disc degeneration, unspecified cervical region: Secondary | ICD-10-CM | POA: Diagnosis not present

## 2020-08-25 DIAGNOSIS — M9901 Segmental and somatic dysfunction of cervical region: Secondary | ICD-10-CM | POA: Diagnosis not present

## 2020-08-26 DIAGNOSIS — E663 Overweight: Secondary | ICD-10-CM | POA: Diagnosis not present

## 2020-08-29 DIAGNOSIS — M5030 Other cervical disc degeneration, unspecified cervical region: Secondary | ICD-10-CM | POA: Diagnosis not present

## 2020-08-29 DIAGNOSIS — M9901 Segmental and somatic dysfunction of cervical region: Secondary | ICD-10-CM | POA: Diagnosis not present

## 2020-08-30 DIAGNOSIS — M9901 Segmental and somatic dysfunction of cervical region: Secondary | ICD-10-CM | POA: Diagnosis not present

## 2020-08-30 DIAGNOSIS — M5030 Other cervical disc degeneration, unspecified cervical region: Secondary | ICD-10-CM | POA: Diagnosis not present

## 2020-09-01 DIAGNOSIS — M9901 Segmental and somatic dysfunction of cervical region: Secondary | ICD-10-CM | POA: Diagnosis not present

## 2020-09-01 DIAGNOSIS — M5030 Other cervical disc degeneration, unspecified cervical region: Secondary | ICD-10-CM | POA: Diagnosis not present

## 2020-09-06 DIAGNOSIS — M9901 Segmental and somatic dysfunction of cervical region: Secondary | ICD-10-CM | POA: Diagnosis not present

## 2020-09-06 DIAGNOSIS — M5030 Other cervical disc degeneration, unspecified cervical region: Secondary | ICD-10-CM | POA: Diagnosis not present

## 2020-09-07 DIAGNOSIS — M9901 Segmental and somatic dysfunction of cervical region: Secondary | ICD-10-CM | POA: Diagnosis not present

## 2020-09-07 DIAGNOSIS — M5030 Other cervical disc degeneration, unspecified cervical region: Secondary | ICD-10-CM | POA: Diagnosis not present

## 2020-09-08 DIAGNOSIS — M9901 Segmental and somatic dysfunction of cervical region: Secondary | ICD-10-CM | POA: Diagnosis not present

## 2020-09-08 DIAGNOSIS — M5030 Other cervical disc degeneration, unspecified cervical region: Secondary | ICD-10-CM | POA: Diagnosis not present

## 2020-09-13 DIAGNOSIS — M9901 Segmental and somatic dysfunction of cervical region: Secondary | ICD-10-CM | POA: Diagnosis not present

## 2020-09-13 DIAGNOSIS — M5030 Other cervical disc degeneration, unspecified cervical region: Secondary | ICD-10-CM | POA: Diagnosis not present

## 2020-09-15 DIAGNOSIS — M9901 Segmental and somatic dysfunction of cervical region: Secondary | ICD-10-CM | POA: Diagnosis not present

## 2020-09-15 DIAGNOSIS — M5030 Other cervical disc degeneration, unspecified cervical region: Secondary | ICD-10-CM | POA: Diagnosis not present

## 2020-09-19 DIAGNOSIS — M9901 Segmental and somatic dysfunction of cervical region: Secondary | ICD-10-CM | POA: Diagnosis not present

## 2020-09-19 DIAGNOSIS — M5030 Other cervical disc degeneration, unspecified cervical region: Secondary | ICD-10-CM | POA: Diagnosis not present

## 2020-09-22 DIAGNOSIS — M5030 Other cervical disc degeneration, unspecified cervical region: Secondary | ICD-10-CM | POA: Diagnosis not present

## 2020-09-22 DIAGNOSIS — M9901 Segmental and somatic dysfunction of cervical region: Secondary | ICD-10-CM | POA: Diagnosis not present

## 2020-10-20 DIAGNOSIS — M9901 Segmental and somatic dysfunction of cervical region: Secondary | ICD-10-CM | POA: Diagnosis not present

## 2020-10-20 DIAGNOSIS — M5030 Other cervical disc degeneration, unspecified cervical region: Secondary | ICD-10-CM | POA: Diagnosis not present

## 2020-11-08 DIAGNOSIS — Z1231 Encounter for screening mammogram for malignant neoplasm of breast: Secondary | ICD-10-CM | POA: Diagnosis not present

## 2020-11-17 DIAGNOSIS — M9901 Segmental and somatic dysfunction of cervical region: Secondary | ICD-10-CM | POA: Diagnosis not present

## 2020-11-17 DIAGNOSIS — M5030 Other cervical disc degeneration, unspecified cervical region: Secondary | ICD-10-CM | POA: Diagnosis not present

## 2020-12-12 ENCOUNTER — Other Ambulatory Visit: Payer: Self-pay | Admitting: Cardiology

## 2020-12-12 NOTE — Telephone Encounter (Signed)
Rx refill sent to pharmacy. 

## 2020-12-15 DIAGNOSIS — M5030 Other cervical disc degeneration, unspecified cervical region: Secondary | ICD-10-CM | POA: Diagnosis not present

## 2020-12-15 DIAGNOSIS — M9901 Segmental and somatic dysfunction of cervical region: Secondary | ICD-10-CM | POA: Diagnosis not present

## 2020-12-16 DIAGNOSIS — L600 Ingrowing nail: Secondary | ICD-10-CM | POA: Diagnosis not present

## 2020-12-16 DIAGNOSIS — L02611 Cutaneous abscess of right foot: Secondary | ICD-10-CM | POA: Diagnosis not present

## 2021-01-04 DIAGNOSIS — L600 Ingrowing nail: Secondary | ICD-10-CM | POA: Diagnosis not present

## 2021-01-09 DIAGNOSIS — K5190 Ulcerative colitis, unspecified, without complications: Secondary | ICD-10-CM | POA: Insufficient documentation

## 2021-01-09 DIAGNOSIS — F419 Anxiety disorder, unspecified: Secondary | ICD-10-CM | POA: Insufficient documentation

## 2021-01-09 DIAGNOSIS — F32A Depression, unspecified: Secondary | ICD-10-CM | POA: Insufficient documentation

## 2021-01-09 DIAGNOSIS — I10 Essential (primary) hypertension: Secondary | ICD-10-CM | POA: Insufficient documentation

## 2021-01-13 DIAGNOSIS — J40 Bronchitis, not specified as acute or chronic: Secondary | ICD-10-CM | POA: Diagnosis not present

## 2021-01-13 DIAGNOSIS — Z20828 Contact with and (suspected) exposure to other viral communicable diseases: Secondary | ICD-10-CM | POA: Diagnosis not present

## 2021-01-13 DIAGNOSIS — I10 Essential (primary) hypertension: Secondary | ICD-10-CM | POA: Diagnosis not present

## 2021-01-13 DIAGNOSIS — J329 Chronic sinusitis, unspecified: Secondary | ICD-10-CM | POA: Diagnosis not present

## 2021-01-16 ENCOUNTER — Ambulatory Visit: Payer: Medicare PPO | Admitting: Cardiology

## 2021-02-08 ENCOUNTER — Ambulatory Visit: Payer: Medicare PPO | Admitting: Cardiology

## 2021-02-08 ENCOUNTER — Encounter: Payer: Self-pay | Admitting: Cardiology

## 2021-02-08 ENCOUNTER — Other Ambulatory Visit: Payer: Self-pay

## 2021-02-08 VITALS — BP 134/70 | HR 80 | Ht 63.0 in | Wt 153.0 lb

## 2021-02-08 DIAGNOSIS — I10 Essential (primary) hypertension: Secondary | ICD-10-CM

## 2021-02-08 DIAGNOSIS — E782 Mixed hyperlipidemia: Secondary | ICD-10-CM

## 2021-02-08 MED ORDER — PRAVASTATIN SODIUM 20 MG PO TABS: ORAL_TABLET | ORAL | 3 refills | Status: DC

## 2021-02-08 MED ORDER — AMLODIPINE BESYLATE 5 MG PO TABS: 5.0000 mg | ORAL_TABLET | Freq: Every day | ORAL | 3 refills | Status: AC

## 2021-02-08 MED ORDER — CLOPIDOGREL BISULFATE 75 MG PO TABS: 75.0000 mg | ORAL_TABLET | Freq: Every day | ORAL | 3 refills | Status: AC

## 2021-02-08 MED ORDER — EZETIMIBE 10 MG PO TABS: 10.0000 mg | ORAL_TABLET | Freq: Every day | ORAL | 3 refills | Status: DC

## 2021-02-08 NOTE — Patient Instructions (Signed)
Medication Instructions:  Your physician recommends that you continue on your current medications as directed. Please refer to the Current Medication list given to you today.  *If you need a refill on your cardiac medications before your next appointment, please call your pharmacy*   Lab Work: Your physician recommends that you return for lab work in: [place], Lipids If you have labs (blood work) drawn today and your tests are completely normal, you will receive your results only by: Marland Kitchen MyChart Message (if you have MyChart) OR . A paper copy in the mail If you have any lab test that is abnormal or we need to change your treatment, we will call you to review the results.   Testing/Procedures: None   Follow-Up: At [facility], you and your health needs are our priority.  As part of our continuing mission to provide you with exceptional heart care, we have created designated Provider Care Teams.  These Care Teams include your primary Cardiologist (physician) and Advanced Practice Providers (APPs -  Physician Assistants and Nurse Practitioners) who all work together to provide you with the care you need, when you need it.  We recommend signing up for the patient portal called "MyChart".  Sign up information is provided on this After Visit Summary.  MyChart is used to connect with patients for Virtual Visits (Telemedicine).  Patients are able to view lab/test results, encounter notes, upcoming appointments, etc.  Non-urgent messages can be sent to your provider as well.   To learn more about what you can do with MyChart, go to [email or web address].    Your next appointment:   1 year(s)  The format for your next appointment:   In Person  Provider:   Shirlee More, MD   Other Instructions

## 2021-02-08 NOTE — Progress Notes (Signed)
Cardiology Office Note:    Date:  02/08/2021   ID:  Samantha Wilkinson, DOB [birth year], MRN [id number]  PCP:  Street, Sharon Mt, MD  Cardiologist:  Shirlee More, MD    Referring MD: [address] Sharon Mt, *    ASSESSMENT:    1. Essential hypertension   2. Mixed hyperlipidemia    PLAN:    In order of problems listed above:  1. She is doing quite well BP is controlled continue current treatment with a long-acting calcium channel blocker encourage her to get back to her program of walking its been so effective in the past. 2. Recheck labs including CMP with liver function lipid profile I suspect her LDL be at target with a combined statin Zetia and if not we should consider Repatha   Next appointment: 1 year   Medication Adjustments/Labs and Tests Ordered: Current medicines are reviewed at length with the patient today.  Concerns regarding medicines are outlined above.  No orders of the defined types were placed in this encounter.  No orders of the defined types were placed in this encounter.   Chief Complaint  Patient presents with  . Follow-up  . Hypertension  . Pre-op Exam    History of Present Illness:    Samantha Wilkinson is a 75 y.o. female with a hx of hypertension and hyperlipidemia and stroke last seen 01/05/2020.  She is poorly statin intolerant and take Zetia and 2 times a week low intensity pravastatin..  She is maintained on dual antiplatelet and calcium channel blocker for BP control Compliance with diet, lifestyle and medications: Yes  She has had a good year continues to do well after stroke still has some visual impairment. No chest pain palpitation or syncope and tolerates intermittent statin without muscle pain or weakness and is anxious to have her lipids checked today I will send a copy to her primary care physician Past Medical History:  Diagnosis Date  . Acute left arterial ischemic stroke, ICA (internal carotid artery) ([place]) 11/12/2014  . Altered mental  status 12/08/2015  . Anxiety   . Aphasia 01/19/2016  . Cerebrovascular disease 03/31/2018  . Convulsions ([place])   . Depression   . Depression with anxiety 12/08/2015  . Encephalopathy 12/08/2015  . History of stroke 03/31/2018  . Hyperlipidemia   . Hypertension   . Hyponatremia 12/08/2015  . Lactic acid acidosis 12/08/2015  . Migraine 11/12/2014  . Psychogenic polydipsia 12/08/2015  . Seizure ([place]) 01/19/2016  . Stroke [facility]) october 2015  . Ulcerative colitis [facility])     Past Surgical History:  Procedure Laterality Date  . ABDOMINAL HYSTERECTOMY    . BACK SURGERY    . CHOLECYSTECTOMY    . ELBOW SURGERY Right   . KNEE SURGERY Right   . locked left shoulder    . SHOULDER SURGERY Right     Current Medications: Current Meds  Medication Sig  . amLODipine (NORVASC) 5 MG tablet Take 1 tablet (5 mg total) by mouth daily.  . APPLE CIDER VINEGAR PO Take by mouth.  Marland Kitchen ascorbic acid (VITAMIN C) 1000 MG tablet Take 1,000 mg by mouth daily.  Marland Kitchen aspirin EC 81 MG tablet Take 81 mg by mouth daily.  . Cetirizine HCl (ZYRTEC ALLERGY PO) Take 25 mg by mouth daily.  . clopidogrel (PLAVIX) 75 MG tablet Take 1 tablet (75 mg total) by mouth daily.  Marland Kitchen ezetimibe (ZETIA) 10 MG tablet Take 1 tablet (10 mg total) by mouth daily.  Marland Kitchen LORazepam (ATIVAN) 1  MG tablet Take 1 mg by mouth 2 (two) times daily as needed.  Marland Kitchen MAGNESIUM PO Take 400 mg by mouth.  . pantoprazole (PROTONIX) 40 MG tablet Take 40 mg by mouth daily.  . pravastatin (PRAVACHOL) 20 MG tablet TAKE ONE TABLET BY MOUTH ON MONDAY AND FRIDAY     Allergies:   Codeine   Social History   Socioeconomic History  . Marital status: Widowed    Spouse name: Not on file  . Number of children: 1  . Years of education: 34  . Highest education level: Not on file  Occupational History  . Occupation: retired  Tobacco Use  . Smoking status: Former Smoker    Quit date: 12/08/1965    Years since quitting: 55.2  . Smokeless tobacco: Never Used  Vaping Use  .  Vaping Use: Never used  Substance and Sexual Activity  . Alcohol use: No    Alcohol/week: 0.0 standard drinks  . Drug use: No  . Sexual activity: Not on file  Other Topics Concern  . Not on file  Social History Narrative   Patient drinks caffeine occasionally.    Patient is right handed.    Social Determinants of Health   Financial Resource Strain: Not on file  Food Insecurity: Not on file  Transportation Needs: Not on file  Physical Activity: Not on file  Stress: Not on file  Social Connections: Not on file     Family History: The patient's family history includes Cancer in her mother; Diabetes in her brother; Heart attack in her father and mother; Heart disease in her brother and father; Hypertension in her brother, father, mother, and sister; Kidney failure in her brother; Seizures in her sister. ROS:   Please see the history of present illness.    All other systems reviewed and are negative.  EKGs/Labs/Other Studies Reviewed:    The following studies were reviewed today:  EKG:  EKG ordered today and personally reviewed.  The ekg ordered today demonstrates sinus rhythm normal  Recent Labs: No results found for requested labs within last 8760 hours.  Recent Lipid Panel    Component Value Date/Time   CHOL 194 01/05/2020 0901   TRIG 149 01/05/2020 0901   HDL 53 01/05/2020 0901   CHOLHDL 3.7 01/05/2020 0901   CHOLHDL 2.6 12/09/2015 0409   VLDL 9 12/09/2015 0409   LDLCALC 115 (H) 01/05/2020 0901    Physical Exam:    VS:  BP 134/70   Pulse 80   Ht 5' 3"  (1.6 m)   Wt 153 lb (69.4 kg)   SpO2 96%   BMI 27.10 kg/m     Wt Readings from Last 3 Encounters:  02/08/21 153 lb (69.4 kg)  01/05/20 152 lb (68.9 kg)  12/03/18 151 lb (68.5 kg)     GEN:  Well nourished, well developed in no acute distress HEENT: Normal NECK: No JVD; No carotid bruits LYMPHATICS: No lymphadenopathy CARDIAC: RRR, no murmurs, rubs, gallops RESPIRATORY:  Clear to auscultation without  rales, wheezing or rhonchi  ABDOMEN: Soft, non-tender, non-distended MUSCULOSKELETAL:  No edema; No deformity  SKIN: Warm and dry NEUROLOGIC:  Alert and oriented x 3 PSYCHIATRIC:  Normal affect    Signed, Shirlee More, MD  02/08/2021 2:49 PM    [facility]

## 2021-02-09 ENCOUNTER — Telehealth: Payer: Self-pay

## 2021-02-09 DIAGNOSIS — I10 Essential (primary) hypertension: Secondary | ICD-10-CM

## 2021-02-09 DIAGNOSIS — M5030 Other cervical disc degeneration, unspecified cervical region: Secondary | ICD-10-CM | POA: Diagnosis not present

## 2021-02-09 DIAGNOSIS — M9901 Segmental and somatic dysfunction of cervical region: Secondary | ICD-10-CM | POA: Diagnosis not present

## 2021-02-09 DIAGNOSIS — I679 Cerebrovascular disease, unspecified: Secondary | ICD-10-CM

## 2021-02-09 DIAGNOSIS — E782 Mixed hyperlipidemia: Secondary | ICD-10-CM

## 2021-02-09 LAB — COMPREHENSIVE METABOLIC PANEL
ALT: 49 IU/L — ABNORMAL HIGH (ref 0–32)
AST: 42 IU/L — ABNORMAL HIGH (ref 0–40)
Albumin/Globulin Ratio: 1.7 (ref 1.2–2.2)
Albumin: 4.7 g/dL (ref 3.7–4.7)
Alkaline Phosphatase: 87 IU/L (ref 44–121)
BUN/Creatinine Ratio: 22 (ref 12–28)
BUN: 18 mg/dL (ref 8–27)
Bilirubin Total: 0.2 mg/dL (ref 0.0–1.2)
CO2: 23 mmol/L (ref 20–29)
Calcium: 9.6 mg/dL (ref 8.7–10.3)
Chloride: 102 mmol/L (ref 96–106)
Creatinine, Ser: 0.83 mg/dL (ref 0.57–1.00)
GFR calc Af Amer: 81 mL/min/{1.73_m2} (ref >59)
GFR calc non Af Amer: 70 mL/min/{1.73_m2} (ref >59)
Globulin, Total: 2.7 g/dL (ref 1.5–4.5)
Glucose: 100 mg/dL — ABNORMAL HIGH (ref 65–99)
Potassium: 4 mmol/L (ref 3.5–5.2)
Sodium: 142 mmol/L (ref 134–144)
Total Protein: 7.4 g/dL (ref 6.0–8.5)

## 2021-02-09 LAB — LIPID PANEL
Chol/HDL Ratio: 4.2 ratio (ref 0.0–4.4)
Cholesterol, Total: 196 mg/dL (ref 100–199)
HDL: 47 mg/dL (ref >39)
LDL Chol Calc (NIH): 113 mg/dL — ABNORMAL HIGH (ref 0–99)
Triglycerides: 206 mg/dL — ABNORMAL HIGH (ref 0–149)
VLDL Cholesterol Cal: 36 mg/dL (ref 5–40)

## 2021-02-09 MED ORDER — REPATHA SURECLICK 140 MG/ML ~~LOC~~ SOAJ: 140.0000 mg | SUBCUTANEOUS | 3 refills | Status: DC

## 2021-02-09 NOTE — Telephone Encounter (Signed)
Patient notified of results, verbalized understanding and agrees with Dr. Joya Gaskins recommendations. Repatha sent to her pharmacy, lab order completed. Patient aware to stop Pravastatin when she starts Repatha and aware to continue on Zetia. She also knows to come by in 6 weeks for labs.

## 2021-02-09 NOTE — Telephone Encounter (Signed)
Patient returning call. She states she has an appointment at 4:00 pm and is not sure when she will be out. She states she will be available tomorrow.

## 2021-02-09 NOTE — Telephone Encounter (Signed)
-----   Message from Richardo Priest, MD sent at 02/09/2021 12:12 PM EST ----- Her LDL cholesterol is unchanged despite taking intermittent statin  With a history of stroke advise Repatha 140 every 2 weeks and stop her statin.  Follow-up lipid profile in 6 weeks.

## 2021-02-09 NOTE — Addendum Note (Signed)
Addended by: Darrel Reach on: 02/09/2021 03:57 PM   Modules accepted: Orders

## 2021-02-09 NOTE — Telephone Encounter (Signed)
Left message on patients voicemail to please return our call.   

## 2021-02-10 ENCOUNTER — Encounter: Payer: Self-pay | Admitting: *Deleted

## 2021-02-14 ENCOUNTER — Telehealth: Payer: Self-pay | Admitting: Cardiology

## 2021-02-14 NOTE — Telephone Encounter (Signed)
Spoke to the patient and let her know that we will remain with her 1 year follow up for now. She verbalizes understanding of this and thanks me for the call back.

## 2021-02-14 NOTE — Telephone Encounter (Signed)
I respect her decision I would continue her pravastatin and Zetia.

## 2021-02-14 NOTE — Telephone Encounter (Signed)
No

## 2021-02-14 NOTE — Telephone Encounter (Signed)
Patient informed she can continue her pravastatin and zetia.

## 2021-02-14 NOTE — Telephone Encounter (Signed)
Pt c/o medication issue:  1. Name of Medication: Evolocumab (REPATHA SURECLICK) 301 MG/ML SOAJ  2. How are you currently taking this medication (dosage and times per day)?  Patient is not taking this medication.  3. Are you having a reaction (difficulty breathing--STAT)?  No   4. What is your medication issue?    She states she has concerns with starting a medication as strong as Repatha for her cholesterol. She states she is willing to modify her diet and make the necessary adjustments, however, she states she will not take it.  She is requesting to discuss alternatives.  Please call.

## 2021-03-06 DIAGNOSIS — I679 Cerebrovascular disease, unspecified: Secondary | ICD-10-CM | POA: Diagnosis not present

## 2021-03-09 DIAGNOSIS — M5030 Other cervical disc degeneration, unspecified cervical region: Secondary | ICD-10-CM | POA: Diagnosis not present

## 2021-03-09 DIAGNOSIS — M9901 Segmental and somatic dysfunction of cervical region: Secondary | ICD-10-CM | POA: Diagnosis not present

## 2021-04-06 DIAGNOSIS — M9901 Segmental and somatic dysfunction of cervical region: Secondary | ICD-10-CM | POA: Diagnosis not present

## 2021-04-06 DIAGNOSIS — M5030 Other cervical disc degeneration, unspecified cervical region: Secondary | ICD-10-CM | POA: Diagnosis not present

## 2021-04-14 DIAGNOSIS — Z20822 Contact with and (suspected) exposure to covid-19: Secondary | ICD-10-CM | POA: Diagnosis not present

## 2021-05-11 DIAGNOSIS — M9901 Segmental and somatic dysfunction of cervical region: Secondary | ICD-10-CM | POA: Diagnosis not present

## 2021-05-11 DIAGNOSIS — M5030 Other cervical disc degeneration, unspecified cervical region: Secondary | ICD-10-CM | POA: Diagnosis not present

## 2021-05-18 DIAGNOSIS — I10 Essential (primary) hypertension: Secondary | ICD-10-CM | POA: Diagnosis not present

## 2021-05-18 DIAGNOSIS — Z8673 Personal history of transient ischemic attack (TIA), and cerebral infarction without residual deficits: Secondary | ICD-10-CM | POA: Diagnosis not present

## 2021-05-18 DIAGNOSIS — Z0000 Encounter for general adult medical examination without abnormal findings: Secondary | ICD-10-CM | POA: Diagnosis not present

## 2021-05-18 DIAGNOSIS — G43109 Migraine with aura, not intractable, without status migrainosus: Secondary | ICD-10-CM | POA: Diagnosis not present

## 2021-05-18 DIAGNOSIS — I672 Cerebral atherosclerosis: Secondary | ICD-10-CM | POA: Diagnosis not present

## 2021-05-18 DIAGNOSIS — I6930 Unspecified sequelae of cerebral infarction: Secondary | ICD-10-CM | POA: Diagnosis not present

## 2021-05-18 DIAGNOSIS — Z87898 Personal history of other specified conditions: Secondary | ICD-10-CM | POA: Diagnosis not present

## 2021-05-18 DIAGNOSIS — E785 Hyperlipidemia, unspecified: Secondary | ICD-10-CM | POA: Diagnosis not present

## 2021-05-18 DIAGNOSIS — K2960 Other gastritis without bleeding: Secondary | ICD-10-CM | POA: Diagnosis not present

## 2021-05-22 DIAGNOSIS — E559 Vitamin D deficiency, unspecified: Secondary | ICD-10-CM | POA: Diagnosis not present

## 2021-05-22 DIAGNOSIS — D649 Anemia, unspecified: Secondary | ICD-10-CM | POA: Diagnosis not present

## 2021-05-22 DIAGNOSIS — Z79899 Other long term (current) drug therapy: Secondary | ICD-10-CM | POA: Diagnosis not present

## 2021-05-22 DIAGNOSIS — R7301 Impaired fasting glucose: Secondary | ICD-10-CM | POA: Diagnosis not present

## 2021-05-22 DIAGNOSIS — R739 Hyperglycemia, unspecified: Secondary | ICD-10-CM | POA: Diagnosis not present

## 2021-05-22 DIAGNOSIS — E785 Hyperlipidemia, unspecified: Secondary | ICD-10-CM | POA: Diagnosis not present

## 2021-05-22 DIAGNOSIS — K2960 Other gastritis without bleeding: Secondary | ICD-10-CM | POA: Diagnosis not present

## 2021-05-22 DIAGNOSIS — M8580 Other specified disorders of bone density and structure, unspecified site: Secondary | ICD-10-CM | POA: Diagnosis not present

## 2021-06-04 ENCOUNTER — Other Ambulatory Visit: Payer: Self-pay | Admitting: Cardiology

## 2021-06-05 NOTE — Telephone Encounter (Signed)
I confirmed w/ the patient she is not on Repatha. Spoke with Lilia Pro and per Dr. Bettina Gavia verbally, instructions corrected to 1 tab po twice weekly. Instruction updated and sent to the pharmacy

## 2021-06-20 DIAGNOSIS — M5030 Other cervical disc degeneration, unspecified cervical region: Secondary | ICD-10-CM | POA: Diagnosis not present

## 2021-06-20 DIAGNOSIS — M9901 Segmental and somatic dysfunction of cervical region: Secondary | ICD-10-CM | POA: Diagnosis not present

## 2021-07-11 ENCOUNTER — Encounter: Payer: Self-pay | Admitting: Gastroenterology

## 2021-07-12 ENCOUNTER — Encounter: Payer: Self-pay | Admitting: Gastroenterology

## 2021-07-23 DIAGNOSIS — R1030 Lower abdominal pain, unspecified: Secondary | ICD-10-CM | POA: Diagnosis not present

## 2021-07-23 DIAGNOSIS — R300 Dysuria: Secondary | ICD-10-CM | POA: Diagnosis not present

## 2021-07-24 DIAGNOSIS — Z7902 Long term (current) use of antithrombotics/antiplatelets: Secondary | ICD-10-CM | POA: Diagnosis not present

## 2021-07-24 DIAGNOSIS — N390 Urinary tract infection, site not specified: Secondary | ICD-10-CM | POA: Diagnosis not present

## 2021-07-24 DIAGNOSIS — I10 Essential (primary) hypertension: Secondary | ICD-10-CM | POA: Diagnosis not present

## 2021-07-24 DIAGNOSIS — R5383 Other fatigue: Secondary | ICD-10-CM | POA: Diagnosis not present

## 2021-07-24 DIAGNOSIS — F322 Major depressive disorder, single episode, severe without psychotic features: Secondary | ICD-10-CM | POA: Diagnosis not present

## 2021-07-24 DIAGNOSIS — Z792 Long term (current) use of antibiotics: Secondary | ICD-10-CM | POA: Diagnosis not present

## 2021-07-24 DIAGNOSIS — K219 Gastro-esophageal reflux disease without esophagitis: Secondary | ICD-10-CM | POA: Diagnosis not present

## 2021-07-24 DIAGNOSIS — Z79899 Other long term (current) drug therapy: Secondary | ICD-10-CM | POA: Diagnosis not present

## 2021-07-24 DIAGNOSIS — R45851 Suicidal ideations: Secondary | ICD-10-CM | POA: Diagnosis not present

## 2021-07-24 DIAGNOSIS — Z8673 Personal history of transient ischemic attack (TIA), and cerebral infarction without residual deficits: Secondary | ICD-10-CM | POA: Diagnosis not present

## 2021-07-27 DIAGNOSIS — R45851 Suicidal ideations: Secondary | ICD-10-CM | POA: Diagnosis not present

## 2021-07-27 DIAGNOSIS — I10 Essential (primary) hypertension: Secondary | ICD-10-CM | POA: Diagnosis not present

## 2021-07-27 DIAGNOSIS — F339 Major depressive disorder, recurrent, unspecified: Secondary | ICD-10-CM | POA: Diagnosis not present

## 2021-07-27 DIAGNOSIS — Z885 Allergy status to narcotic agent status: Secondary | ICD-10-CM | POA: Diagnosis not present

## 2021-07-27 DIAGNOSIS — Z9889 Other specified postprocedural states: Secondary | ICD-10-CM | POA: Diagnosis not present

## 2021-07-27 DIAGNOSIS — Z9079 Acquired absence of other genital organ(s): Secondary | ICD-10-CM | POA: Diagnosis not present

## 2021-07-27 DIAGNOSIS — Z7982 Long term (current) use of aspirin: Secondary | ICD-10-CM | POA: Diagnosis not present

## 2021-07-27 DIAGNOSIS — F332 Major depressive disorder, recurrent severe without psychotic features: Secondary | ICD-10-CM | POA: Diagnosis not present

## 2021-07-27 DIAGNOSIS — Z79899 Other long term (current) drug therapy: Secondary | ICD-10-CM | POA: Diagnosis not present

## 2021-07-27 DIAGNOSIS — F419 Anxiety disorder, unspecified: Secondary | ICD-10-CM | POA: Diagnosis not present

## 2021-07-27 HISTORY — DX: Major depressive disorder, recurrent, unspecified: F33.9

## 2021-07-30 ENCOUNTER — Other Ambulatory Visit: Payer: Self-pay | Admitting: Cardiology

## 2021-08-04 DIAGNOSIS — F4329 Adjustment disorder with other symptoms: Secondary | ICD-10-CM | POA: Diagnosis not present

## 2021-08-04 DIAGNOSIS — G4700 Insomnia, unspecified: Secondary | ICD-10-CM | POA: Diagnosis not present

## 2021-08-04 DIAGNOSIS — F331 Major depressive disorder, recurrent, moderate: Secondary | ICD-10-CM | POA: Diagnosis not present

## 2021-08-04 DIAGNOSIS — F4321 Adjustment disorder with depressed mood: Secondary | ICD-10-CM | POA: Diagnosis not present

## 2021-08-04 DIAGNOSIS — Z6824 Body mass index (BMI) 24.0-24.9, adult: Secondary | ICD-10-CM | POA: Diagnosis not present

## 2021-08-08 DIAGNOSIS — I679 Cerebrovascular disease, unspecified: Secondary | ICD-10-CM | POA: Diagnosis not present

## 2021-08-08 DIAGNOSIS — F32A Depression, unspecified: Secondary | ICD-10-CM | POA: Diagnosis not present

## 2021-08-15 ENCOUNTER — Ambulatory Visit (INDEPENDENT_AMBULATORY_CARE_PROVIDER_SITE_OTHER): Payer: Medicare PPO | Admitting: Gastroenterology

## 2021-08-15 ENCOUNTER — Other Ambulatory Visit: Payer: Self-pay

## 2021-08-15 ENCOUNTER — Other Ambulatory Visit (INDEPENDENT_AMBULATORY_CARE_PROVIDER_SITE_OTHER): Payer: Medicare PPO

## 2021-08-15 ENCOUNTER — Encounter: Payer: Self-pay | Admitting: Gastroenterology

## 2021-08-15 VITALS — BP 132/78 | HR 100 | Ht 63.0 in | Wt 145.4 lb

## 2021-08-15 DIAGNOSIS — R1011 Right upper quadrant pain: Secondary | ICD-10-CM | POA: Diagnosis not present

## 2021-08-15 DIAGNOSIS — R197 Diarrhea, unspecified: Secondary | ICD-10-CM

## 2021-08-15 DIAGNOSIS — R195 Other fecal abnormalities: Secondary | ICD-10-CM

## 2021-08-15 DIAGNOSIS — R112 Nausea with vomiting, unspecified: Secondary | ICD-10-CM

## 2021-08-15 LAB — COMPREHENSIVE METABOLIC PANEL
ALT: 56 U/L — ABNORMAL HIGH (ref 0–35)
AST: 39 U/L — ABNORMAL HIGH (ref 0–37)
Albumin: 4.8 g/dL (ref 3.5–5.2)
Alkaline Phosphatase: 77 U/L (ref 39–117)
BUN: 14 mg/dL (ref 6–23)
CO2: 28 mEq/L (ref 19–32)
Calcium: 10.2 mg/dL (ref 8.4–10.5)
Chloride: 102 mEq/L (ref 96–112)
Creatinine, Ser: 0.83 mg/dL (ref 0.40–1.20)
GFR: 69.8 mL/min (ref >60.00)
Glucose, Bld: 77 mg/dL (ref 70–99)
Potassium: 4.5 mEq/L (ref 3.5–5.1)
Sodium: 139 mEq/L (ref 135–145)
Total Bilirubin: 0.4 mg/dL (ref 0.2–1.2)
Total Protein: 8 g/dL (ref 6.0–8.3)

## 2021-08-15 LAB — CBC WITH DIFFERENTIAL/PLATELET
Basophils Absolute: 0.1 10*3/uL (ref 0.0–0.1)
Basophils Relative: 1.3 % (ref 0.0–3.0)
Eosinophils Absolute: 0.1 10*3/uL (ref 0.0–0.7)
Eosinophils Relative: 2 % (ref 0.0–5.0)
HCT: 39.4 % (ref 36.0–46.0)
Hemoglobin: 13.5 g/dL (ref 12.0–15.0)
Lymphocytes Relative: 31.5 % (ref 12.0–46.0)
Lymphs Abs: 2.1 10*3/uL (ref 0.7–4.0)
MCHC: 34.2 g/dL (ref 30.0–36.0)
MCV: 87.9 fl (ref 78.0–100.0)
Monocytes Absolute: 0.5 10*3/uL (ref 0.1–1.0)
Monocytes Relative: 7.8 % (ref 3.0–12.0)
Neutro Abs: 3.9 10*3/uL (ref 1.4–7.7)
Neutrophils Relative %: 57.4 % (ref 43.0–77.0)
Platelets: 304 10*3/uL (ref 150.0–400.0)
RBC: 4.49 Mil/uL (ref 3.87–5.11)
RDW: 13.1 % (ref 11.5–15.5)
WBC: 6.8 10*3/uL (ref 4.0–10.5)

## 2021-08-15 LAB — LIPASE: Lipase: 26 U/L (ref 11.0–59.0)

## 2021-08-15 LAB — C-REACTIVE PROTEIN: CRP: <1 mg/dL (ref 0.5–20.0)

## 2021-08-15 MED ORDER — ONDANSETRON 4 MG PO TBDP: 4.0000 mg | ORAL_TABLET | Freq: Four times a day (QID) | ORAL | 2 refills | Status: DC | PRN

## 2021-08-15 NOTE — Progress Notes (Signed)
Chief Complaint: abdo pain, N/V  Referring Provider:  Street, Sharon Mt, *      ASSESSMENT AND PLAN;   #1. Episodic N/V/diarrhea. H/O wt loss is concerning.  #2. + FIT  #3. RUQ pai. Neg [place] at [facility] recently. S/P laparoscopic cholecystectomy 13-Mar-2013 d/t biliary dyskinesia. Nl CBC, CMP 04/2021  Plan: -CT AP with p.o. and IV contrast -CBC, CMP, celiac, CRP, lipase -EGD/colon with miralax off plavix x 5 days (if OK with Dr Venetia Maxon) -Zofran 14m ODT Q 6hrs prn #20 -If still with problems, will give her a trial of Levsin.   I discussed EGD/Colonoscopy- the indications, risks, alternatives and potential complications including, but not limited to, bleeding, infection, reaction to medication, damage to internal organs, cardiac and/or pulmonary problems, and perforation requiring surgery (1 to 2 in 1000). The possibility that significant findings could be missed was explained. All ? were answered. The patient gives consent to proceed. HPI:    Samantha RADERis a 75y.o. female  H/O CVA on plavix/ASA, HTN, HLD, anxiety/depression, history of seizures  With 3 discrete episodes of nausea/vomiting in 203-14-2021and 2 episodes in 203-14-22with sudden onset of nausea/vomiting after eating certain foods like fish or eating out.  This lasts for about 12 hours and finally culminated into diarrhea.  She gets exhausted.  Next day she is absolutely normal.  She also has been having associated sharp RUQ pain with tenderness.  Had normal CBC with hemoglobin 13.2, MCV 86 on 04/2021.  She had normal CMP, hemoglobin A1c, normal TSH  She has been tested negative for any food allergies.  She also has right upper quadrant abdominal discomfort, gastroesophageal reflux.  She has been taking Protonix up to twice a day.  She denies having any excessive heartburn during the above episodes.  She had negative right upper quadrant ultrasound.  She had normal CBC, CMP and TSH previously.  She has lost some weight.  She  was also found to be positive for fit test 05/2019  Note that she is s/p cholecystectomy due to biliary dyskinesia in past.   Past GI procedures: EGD 22011/03/14by Dr. MLyda Jesterfor N/V/RUQ pain -Gastric polyps.  Otherwise normal EGD. Bx-fundic gland polyps.  Negative CLOtest.  EGD 06/2012 report not available -Fundic gland polyps -Negative small bowel biopsies for celiac.  Colonoscopy 05/2010 heme positive stools by Dr. MLyda Jester([facility]) -Early diverticular disease left colon -Small internal hemorrhoids    Wt Readings from Last 3 Encounters:  08/15/21 145 lb 6 oz (65.9 kg)  02/08/21 153 lb (69.4 kg)  01/05/20 152 lb (68.9 kg)   SH-her husband of 563years died of brain cancer in 203/13/20 she was the main caregiver.  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  Has 1 boy. Past Medical History:  Diagnosis Date   Acute left arterial ischemic stroke, [place]) [place] 11/12[place]2014   Altered mental status 12/08/2015   Anxiety    Aphasia 01/19/2016   Cerebrovascular disease 03/31/2018   [place]    Depression    Depression with anxiety 12/08/2015   Encephalopathy 12/08/2015   History of stroke 03/31/2018   Hyperlipidemia    Hypertension    Hyponatremia 12/08/2015   Lactic acid acidosis 12/08/2015   Migraine 11/12/2014   Psychogenic polydipsia 12/08/2015   [place]   [facility]ober 2015   [facility]cer[facility]     Past Surgical History:  Procedure Laterality Date   ABDOMINAL  [place]   BACK SURGERY     CHOLECYSTECTOMY  2014   ELBOW SURGERY Right    KNEE SURGERY Right    locked left shoulder     SHOULDER SURGERY Right     Family History  Problem Relation Age of Onset   Heart attack Mother    Cancer Mother    Hypertension Mother    Heart attack Father    Heart disease Father    Hypertension Father    Seizures Sister    Hypertension Sister    Diabetes Brother    Kidney  failure Brother    Hypertension Brother    Heart disease Brother    Colon cancer Neg Hx    Pancreatic cancer Neg Hx    Stomach cancer Neg Hx    Liver disease Neg Hx    Esophageal cancer Neg Hx     Social History   Tobacco Use   Smoking status: Former    Types: Cigarettes    Quit date: 12/08/1965    Years since quitting: 55.7   Smokeless tobacco: Never  Vaping Use   Vaping Use: Never used  Substance Use Topics   Alcohol use: No    Alcohol/week: 0.0 standard drinks   Drug use: No    Current Outpatient Medications  Medication Sig Dispense Refill   amLODipine (NORVASC) 5 MG tablet Take 1 tablet (5 mg total) by mouth daily. 90 tablet 3   APPLE CIDER VINEGAR PO Take by mouth.     ascorbic acid (VITAMIN C) 1000 MG tablet Take 1,000 mg by mouth daily.     Cetirizine HCl (ZYRTEC ALLERGY PO) Take 25 mg by mouth daily.     clopidogrel (PLAVIX) 75 MG tablet Take 1 tablet (75 mg total) by mouth daily. 90 tablet 3   ezetimibe (ZETIA) 10 MG tablet Take 1 tablet (10 mg total) by mouth daily. 90 tablet 3   LORazepam (ATIVAN) 1 MG tablet Take 1 mg by mouth 2 (two) times daily as needed.     MAGNESIUM PO Take 400 mg by mouth.     pantoprazole (PROTONIX) 40 MG tablet Take 40 mg by mouth daily.  3   pravastatin (PRAVACHOL) 20 MG tablet TAKE ONE TABLET BY MOUTH ON MONDAY AND FRIDAY 8 tablet 1   aspirin EC 81 MG tablet Take 81 mg by mouth daily.     Evolocumab (REPATHA SURECLICK) 573 MG/ML SOAJ Inject 140 mg into the skin every 14 (fourteen) days. 6 mL 3   No current facility-administered medications for this visit.    Allergies  Allergen Reactions   Codeine Other (See Comments)    "shaky, jittery"  "makes my head feel funny"    Review of Systems:  Constitutional: Denies fever, chills, diaphoresis, appetite change and fatigue.  HEENT: Denies photophobia, eye pain, redness, hearing loss, ear pain, congestion, sore throat, rhinorrhea, sneezing, mouth sores, neck pain, neck stiffness and  tinnitus.   Respiratory: Denies SOB, DOE, cough, chest tightness,  and wheezing.   Cardiovascular: Denies chest pain, palpitations and leg swelling.  Genitourinary: Denies dysuria, urgency, frequency, hematuria, flank pain and difficulty urinating.  Musculoskeletal: Denies myalgias, back pain, joint swelling, arthralgias and gait problem.  Skin: No rash.  Neurological: Denies dizziness, seizures, syncope, weakness, light-headedness, numbness and headaches.  Hematological: Denies adenopathy. Easy bruising, personal or family bleeding history  Psychiatric/Behavioral: has anxiety or depression.  Has been having sleeping problems.     Physical Exam:  BP 132/78   Pulse 100   Ht 5' 3"  (1.6 m)   Wt 145 lb 6 oz (65.9 kg)   SpO2 95%   BMI 25.75 kg/m  Wt Readings from Last 3 Encounters:  08/15/21 145 lb 6 oz (65.9 kg)  02/08/21 153 lb (69.4 kg)  01/05/20 152 lb (68.9 kg)   Constitutional:  Well-developed, in no acute distress. Psychiatric: Normal mood and affect. Behavior is normal. HEENT: Pupils normal.  Conjunctivae are normal. No scleral icterus. Neck supple.  Cardiovascular: Normal rate, regular rhythm. No edema Pulmonary/chest: Effort normal and breath sounds normal. No wheezing, rales or rhonchi. Abdominal: Soft, nondistended. Nontender. Bowel sounds active throughout. There are no masses palpable. No hepatomegaly. Rectal: Deferred Neurological: Alert and oriented to person place and time. Skin: Skin is warm and dry. No rashes noted.  Data Reviewed: I have personally reviewed following labs and imaging studies  CBC: CBC Latest Ref Rng & Units 12/13/2015 12/12/2015 12/11/2015  WBC 4.0 - 10.5 K/uL 7.8 7.7 8.3  Hemoglobin 12.0 - 15.0 g/dL 9.5(L) 9.8(L) 9.6(L)  Hematocrit 36.0 - 46.0 % 28.2(L) 29.3(L) 28.2(L)  Platelets 150 - 400 K/uL 280 299 277    CMP: CMP Latest Ref Rng & Units 02/08/2021 01/05/2020 01/05/2019  Glucose 65 - 99 mg/dL 100(H) 93 92  BUN 8 - 27 mg/dL 18 14 14    Creatinine 0.57 - 1.00 mg/dL 0.83 0.71 0.78  Sodium 134 - 144 mmol/L 142 139 142  Potassium 3.5 - 5.2 mmol/L 4.0 4.2 4.1  Chloride 96 - 106 mmol/L 102 102 103  CO2 20 - 29 mmol/L 23 23 22   Calcium 8.7 - 10.3 mg/dL 9.6 9.5 9.2  Total Protein 6.0 - 8.5 g/dL 7.4 7.8 6.7  Total Bilirubin 0.0 - 1.2 mg/dL 0.2 0.4 0.3  Alkaline Phos 44 - 121 IU/L 87 76 83  AST 0 - 40 IU/L 42(H) 45(H) 36  ALT 0 - 32 IU/L 49(H) 59(H) 49(H)    GFR: CrCl cannot be calculated (Patient's most recent lab result is older than the maximum 21 days allowed.). Liver Function Tests: No results for input(s): AST, ALT, ALKPHOS, BILITOT, PROT, ALBUMIN in the last 168 hours. No results for input(s): LIPASE, AMYLASE in the last 168 hours. No results for input(s): AMMONIA in the last 168 hours. Coagulation Profile: No results for input(s): INR, PROTIME in the last 168 hours. HbA1C: No results for input(s): HGBA1C in the last 72 hours. Lipid Profile: No results for input(s): CHOL, HDL, LDLCALC, TRIG, CHOLHDL, LDLDIRECT in the last 72 hours. Thyroid Function Tests: No results for input(s): TSH, T4TOTAL, FREET4, T3FREE, THYROIDAB in the last 72 hours. Anemia Panel: No results for input(s): VITAMINB12, FOLATE, FERRITIN, TIBC, IRON, RETICCTPCT in the last 72 hours.  No results found for this or any previous visit (from the past 240 hour(s)).    Radiology Studies: No results found.    Carmell Austria, MD 08/15/2021, 9:58 AM  Cc: Street, Sharon Mt, *

## 2021-08-15 NOTE — Patient Instructions (Addendum)
If you are age 74 or older, your body mass index should be between 23-30. Your Body mass index is 25.75 kg/m. If this is out of the aforementioned range listed, please consider follow up with your Primary Care Provider.  If you are age 60 or younger, your body mass index should be between 19-25. Your Body mass index is 25.75 kg/m. If this is out of the aformentioned range listed, please consider follow up with your Primary Care Provider.   __________________________________________________________  The [facility] providers would like to encourage you to use [facility] to communicate with providers for non-urgent requests or questions.  Due to long hold times on the telephone, sending your provider a message by [facility] may be a faster and more efficient way to get a response.  Please allow 48 business hours for a response.  Please remember that this is for non-urgent requests.   You have been scheduled for an endoscopy and colonoscopy. Please follow the written instructions given to you at your visit today. Please pick up your prep supplies at the pharmacy within the next 1-3 days. If you use inhalers (even only as needed), please bring them with you on the day of your procedure.  Please go to the lab on the 2nd floor [address] before you leave the office today.   You will be contacted by our office prior to your procedure for directions on holding your Plavix.  If you do not hear from our office 1 week prior to your scheduled procedure, please call [phone] to discuss.   We have sent the following medications to your pharmacy for you to pick up at your convenience: Zofran  You have been scheduled for a CT scan of the abdomen and pelvis at [facility] are scheduled on     at      . You should arrive 15 minutes prior to your appointment time for registration. Please follow the written instructions below on the day of your exam:  WARNING: IF YOU ARE ALLERGIC TO IODINE/X-RAY DYE,  PLEASE NOTIFY RADIOLOGY IMMEDIATELY AT [phone]! YOU WILL BE GIVEN A 13 HOUR PREMEDICATION PREP.  1) Do not eat or drink anything after     (4 hours prior to your test) 2) You have been given 2 bottles of oral contrast to drink. The solution may taste better if refrigerated, but do NOT add ice or any other liquid to this solution. Shake well before drinking.    Drink 1 bottle of contrast @       2 hours prior to your exam)  Drink 1 bottle of contrast @      (1 hour prior to your exam)  You may take any medications as prescribed with a small amount of water, if necessary. If you take any of the following medications: METFORMIN, GLUCOPHAGE, GLUCOVANCE, AVANDAMET, RIOMET, FORTAMET, [place] MET, JANUMET, GLUMETZA or METAGLIP, you MAY be asked to HOLD this medication 48 hours AFTER the exam.  The purpose of you drinking the oral contrast is to aid in the visualization of your intestinal tract. The contrast solution may cause some diarrhea. Depending on your individual set of symptoms, you may also receive an intravenous injection of x-ray contrast/dye. Plan on being at [facility] for 30 minutes or longer, depending on the type of exam you are having performed.  This test typically takes 30-45 minutes to complete.  If you have any questions regarding your exam or if you need to reschedule, you may  call the CT department at [phone] option 7  between the hours of 8:00 am and 5:00 pm, Monday-Friday.  ________________________________________________________________________   Please call with any questions or concerns.  Thank you,  Dr. Jackquline Denmark

## 2021-08-17 LAB — CELIAC PANEL 10
Antigliadin Abs, IgA: 4 units (ref 0–19)
Endomysial IgA: NEGATIVE
Gliadin IgG: 1 units (ref 0–19)
IgA/Immunoglobulin A, Serum: 134 mg/dL (ref 64–422)
Tissue Transglut Ab: <2 U/mL (ref 0–5)
Transglutaminase IgA: <2 U/mL (ref 0–3)

## 2021-08-21 ENCOUNTER — Telehealth: Payer: Self-pay

## 2021-08-21 NOTE — Telephone Encounter (Signed)
Informed the patient to hold Plavix 5 days prior to the Endo/ Colonoscopy procedure. Patient verbalized understanding.

## 2021-08-22 DIAGNOSIS — M9901 Segmental and somatic dysfunction of cervical region: Secondary | ICD-10-CM | POA: Diagnosis not present

## 2021-08-22 DIAGNOSIS — M5030 Other cervical disc degeneration, unspecified cervical region: Secondary | ICD-10-CM | POA: Diagnosis not present

## 2021-08-24 DIAGNOSIS — R1011 Right upper quadrant pain: Secondary | ICD-10-CM | POA: Diagnosis not present

## 2021-08-24 DIAGNOSIS — R1110 Vomiting, unspecified: Secondary | ICD-10-CM | POA: Diagnosis not present

## 2021-08-24 DIAGNOSIS — R197 Diarrhea, unspecified: Secondary | ICD-10-CM | POA: Diagnosis not present

## 2021-08-24 DIAGNOSIS — R195 Other fecal abnormalities: Secondary | ICD-10-CM | POA: Diagnosis not present

## 2021-08-24 DIAGNOSIS — K760 Fatty (change of) liver, not elsewhere classified: Secondary | ICD-10-CM | POA: Diagnosis not present

## 2021-09-06 DIAGNOSIS — F419 Anxiety disorder, unspecified: Secondary | ICD-10-CM | POA: Diagnosis not present

## 2021-09-06 DIAGNOSIS — Z5181 Encounter for therapeutic drug level monitoring: Secondary | ICD-10-CM | POA: Diagnosis not present

## 2021-09-06 DIAGNOSIS — G4700 Insomnia, unspecified: Secondary | ICD-10-CM | POA: Diagnosis not present

## 2021-09-18 DIAGNOSIS — M5030 Other cervical disc degeneration, unspecified cervical region: Secondary | ICD-10-CM | POA: Diagnosis not present

## 2021-09-18 DIAGNOSIS — M9901 Segmental and somatic dysfunction of cervical region: Secondary | ICD-10-CM | POA: Diagnosis not present

## 2021-09-28 ENCOUNTER — Encounter: Payer: Self-pay | Admitting: Gastroenterology

## 2021-09-28 ENCOUNTER — Other Ambulatory Visit: Payer: Self-pay

## 2021-09-28 ENCOUNTER — Ambulatory Visit (AMBULATORY_SURGERY_CENTER): Payer: Medicare PPO | Admitting: Gastroenterology

## 2021-09-28 VITALS — BP 118/55 | HR 64 | Temp 97.7°F | Resp 11 | Ht 63.0 in | Wt 145.0 lb

## 2021-09-28 DIAGNOSIS — K317 Polyp of stomach and duodenum: Secondary | ICD-10-CM | POA: Diagnosis not present

## 2021-09-28 DIAGNOSIS — R112 Nausea with vomiting, unspecified: Secondary | ICD-10-CM | POA: Diagnosis not present

## 2021-09-28 DIAGNOSIS — D120 Benign neoplasm of cecum: Secondary | ICD-10-CM

## 2021-09-28 DIAGNOSIS — F329 Major depressive disorder, single episode, unspecified: Secondary | ICD-10-CM | POA: Diagnosis not present

## 2021-09-28 DIAGNOSIS — K5730 Diverticulosis of large intestine without perforation or abscess without bleeding: Secondary | ICD-10-CM | POA: Diagnosis not present

## 2021-09-28 DIAGNOSIS — K648 Other hemorrhoids: Secondary | ICD-10-CM

## 2021-09-28 DIAGNOSIS — I10 Essential (primary) hypertension: Secondary | ICD-10-CM | POA: Diagnosis not present

## 2021-09-28 DIAGNOSIS — R195 Other fecal abnormalities: Secondary | ICD-10-CM | POA: Diagnosis not present

## 2021-09-28 DIAGNOSIS — R197 Diarrhea, unspecified: Secondary | ICD-10-CM

## 2021-09-28 DIAGNOSIS — K2970 Gastritis, unspecified, without bleeding: Secondary | ICD-10-CM | POA: Diagnosis not present

## 2021-09-28 DIAGNOSIS — F419 Anxiety disorder, unspecified: Secondary | ICD-10-CM | POA: Diagnosis not present

## 2021-09-28 DIAGNOSIS — R1011 Right upper quadrant pain: Secondary | ICD-10-CM

## 2021-09-28 DIAGNOSIS — D125 Benign neoplasm of sigmoid colon: Secondary | ICD-10-CM | POA: Diagnosis not present

## 2021-09-28 MED ORDER — SODIUM CHLORIDE 0.9 % IV SOLN: 500.0000 mL | Freq: Once | INTRAVENOUS | Status: DC

## 2021-09-28 NOTE — Op Note (Signed)
[facility] Patient Name: Samantha Wilkinson Procedure Date: 09/28/2021 2:56 PM MRN: [id number] Endoscopist: Jackquline Denmark , MD Age: 75 Referring MD:  Date of Birth: [birth year] Gender: Female Account #: [id number] Procedure:                Colonoscopy Indications:              Abdominal pain in the right upper quadrant,                            Clinically significant diarrhea of unexplained                            origin. Neg CT AP Medicines:                Monitored Anesthesia Care Procedure:                Pre-Anesthesia Assessment:                           - Prior to the procedure, a History and Physical                            was performed, and patient medications and                            allergies were reviewed. The patient's tolerance of                            previous anesthesia was also reviewed. The risks                            and benefits of the procedure and the sedation                            options and risks were discussed with the patient.                            All questions were answered, and informed consent                            was obtained. Prior Anticoagulants: The patient has                            taken Plavix (clopidogrel), last dose was 7 days                            prior to procedure. ASA Grade Assessment: III - A                            patient with severe systemic disease. After                            reviewing the risks and benefits, the patient was  deemed in satisfactory condition to undergo the                            procedure.                           After obtaining informed consent, the colonoscope                            was passed under direct vision. Throughout the                            procedure, the patient's blood pressure, pulse, and                            oxygen saturations were monitored continuously. The                            Olympus  PCF-H190DL ([id number]) Colonoscope was                            introduced through the anus and advanced to the 1                            cm into the ileum. The colonoscopy was somewhat                            difficult due to a tortuous colon. Successful                            completion of the procedure was aided by applying                            abdominal pressure. The patient tolerated the                            procedure well. The quality of the bowel                            preparation was good except in some areas of the                            right colon where there was adherent stool.                            Photodocumentation was obtained. This did limit                            examination of the right colon. The ileocecal                            valve, appendiceal orifice, and rectum were  photographed. Scope In: 3:33:25 PM Scope Out: 4:08:20 PM Scope Withdrawal Time: 0 hours 19 minutes 51 seconds  Total Procedure Duration: 0 hours 34 minutes 55 seconds  Findings:                 The colonic mucosa (entire examined portion)                            appeared normal. Biopsies for histology were taken                            with a cold forceps from the entire colon for                            evaluation of microscopic colitis.                           A 10 mm polyp was found in the proximal sigmoid                            colon, 35 cm from the anal verge (on withdrawal).                            The polyp was semi-pedunculated on a broad base.                            The polyp was removed with a hot snare. Resection                            and retrieval were complete.                           A 6 mm polyp was found in the cecum. The polyp was                            sessile. The polyp was removed with a cold snare.                            Resection and retrieval were complete.                            Multiple medium-mouthed diverticula were found in                            the sigmoid colon.                           Non-bleeding internal hemorrhoids were found during                            retroflexion. The hemorrhoids were small.                           The terminal ileum appeared normal.  The exam was otherwise without abnormality on                            direct and retroflexion views. The colon was highly                            redundant and tortuous. Complications:            No immediate complications. Estimated Blood Loss:     Estimated blood loss: none. Impression:               - The entire examined colon is normal. Biopsied.                           - One 10 mm polyp in the proximal sigmoid colon,                            removed with a hot snare. Resected and retrieved.                           - One 6 mm polyp in the cecum, removed with a cold                            snare. Resected and retrieved.                           - Moderate sigmoid diverticulosis.                           - Non-bleeding internal hemorrhoids.                           - The examined portion of the ileum was normal.                           - The examination was otherwise normal on direct                            and retroflexion views. Recommendation:           - Patient has a contact number available for                            emergencies. The signs and symptoms of potential                            delayed complications were discussed with the                            patient. Return to normal activities tomorrow.                            Written discharge instructions were provided to the  patient.                           - Resume previous diet.                           - Continue present medications.                           - Resume Plavix (clopidogrel) at prior dose in 5                             days.                           - Await pathology results.                           - Repeat colonoscopy for surveillance based on                            pathology results.                           - The findings and recommendations were discussed                            with the patient. Jackquline Denmark, MD 09/28/2021 4:22:20 PM This report has been signed electronically.

## 2021-09-28 NOTE — Patient Instructions (Signed)
Handouts given:  polyps, diverticulosis Resume previous diet Continue current medications Resume plavix at prior dose in 5 days Await pathology results  YOU HAD AN ENDOSCOPIC PROCEDURE TODAY AT [facility]:   Refer to the procedure report that was given to you for any specific questions about what was found during the examination.  If the procedure report does not answer your questions, please call your gastroenterologist to clarify.  If you requested that your care partner not be given the details of your procedure findings, then the procedure report has been included in a sealed envelope for you to review at your convenience later.  YOU SHOULD EXPECT: Some feelings of bloating in the abdomen. Passage of more gas than usual.  Walking can help get rid of the air that was put into your GI tract during the procedure and reduce the bloating. If you had a lower endoscopy (such as a colonoscopy or flexible sigmoidoscopy) you may notice spotting of blood in your stool or on the toilet paper. If you underwent a bowel prep for your procedure, you may not have a normal bowel movement for a few days.  Please Note:  You might notice some irritation and congestion in your nose or some drainage.  This is from the oxygen used during your procedure.  There is no need for concern and it should clear up in a day or so.  SYMPTOMS TO REPORT IMMEDIATELY:  Following lower endoscopy (colonoscopy or flexible sigmoidoscopy):  Excessive amounts of blood in the stool  Significant tenderness or worsening of abdominal pains  Swelling of the abdomen that is new, acute  Fever of 100F or higher  Following upper endoscopy (EGD)  Vomiting of blood or coffee ground material  New chest pain or pain under the shoulder blades  Painful or persistently difficult swallowing  New shortness of breath  Fever of 100F or higher  Black, tarry-looking stools  For urgent or emergent issues, a gastroenterologist can  be reached at any hour by calling [phone]. Do not use MyChart messaging for urgent concerns.   DIET:  We do recommend a small meal at first, but then you may proceed to your regular diet.  Drink plenty of fluids but you should avoid alcoholic beverages for 24 hours.  ACTIVITY:  You should plan to take it easy for the rest of today and you should NOT DRIVE or use heavy machinery until tomorrow (because of the sedation medicines used during the test).    FOLLOW UP: Our staff will call the number listed on your records 48-72 hours following your procedure to check on you and address any questions or concerns that you may have regarding the information given to you following your procedure. If we do not reach you, we will leave a message.  We will attempt to reach you two times.  During this call, we will ask if you have developed any symptoms of COVID 19. If you develop any symptoms (ie: fever, flu-like symptoms, shortness of breath, cough etc.) before then, please call [phone].  If you test positive for Covid 19 in the 2 weeks post procedure, please call and report this information to [place].    If any biopsies were taken you will be contacted by phone or by letter within the next 1-3 weeks.  Please call us at [phone] if you have not heard about the biopsies in 3 weeks.   SIGNATURES/CONFIDENTIALITY: You and/or your care partner have signed paperwork which will be  entered into your electronic medical record.  These signatures attest to the fact that that the information above on your After Visit Summary has been reviewed and is understood.  Full responsibility of the confidentiality of this discharge information lies with you and/or your care-partner.

## 2021-09-28 NOTE — Progress Notes (Signed)
To PACU, VSS. Report to [email or web address] 

## 2021-09-28 NOTE — Progress Notes (Signed)
VS completed by DT.    Medical history reviewed and updated.  

## 2021-09-28 NOTE — Progress Notes (Signed)
Called to room to assist during endoscopic procedure.  Patient ID and intended procedure confirmed with present staff. Received instructions for my participation in the procedure from the performing physician.  

## 2021-09-28 NOTE — Op Note (Signed)
[facility] Patient Name: Samantha Wilkinson Procedure Date: 09/28/2021 3:02 PM MRN: [id number] Endoscopist: Jackquline Denmark , MD Age: 75 Referring MD:  Date of Birth: 1946-07-18 Gender: Female Account #: [id number] Procedure:                Upper GI endoscopy Indications:              RUQ abdominal pain Medicines:                Monitored Anesthesia Care Procedure:                Pre-Anesthesia Assessment:                           - Prior to the procedure, a History and Physical                            was performed, and patient medications and                            allergies were reviewed. The patient's tolerance of                            previous anesthesia was also reviewed. The risks                            and benefits of the procedure and the sedation                            options and risks were discussed with the patient.                            All questions were answered, and informed consent                            was obtained. Prior Anticoagulants: The patient has                            taken Plavix (clopidogrel), last dose was 7 days                            prior to procedure. ASA Grade Assessment: III - A                            patient with severe systemic disease. After                            reviewing the risks and benefits, the patient was                            deemed in satisfactory condition to undergo the                            procedure.  After obtaining informed consent, the endoscope was                            passed under direct vision. Throughout the                            procedure, the patient's blood pressure, pulse, and                            oxygen saturations were monitored continuously. The                            Endoscope was introduced through the mouth, and                            advanced to the second part of duodenum. The upper                             GI endoscopy was accomplished without difficulty.                            The patient tolerated the procedure well. Scope In: Scope Out: Findings:                 The examined esophagus was normal with well-defined                            Z-line at 38 cm.                           Localized minimal inflammation characterized by                            erythema was found in the gastric antrum. Biopsies                            were taken with a cold forceps for histology.                           A few 2 to 4 mm sessile polyps with no bleeding and                            no stigmata of recent bleeding were found in the                            gastric fundus and in the gastric body. Three                            polyps were removed with a cold biopsy forceps.                            Resection and retrieval were complete.  The examined duodenum was normal. Biopsies for                            histology were taken with a cold forceps for                            evaluation of celiac disease. Complications:            No immediate complications. Estimated Blood Loss:     Estimated blood loss: none. Impression:               - Normal esophagus.                           - Gastritis. Biopsied.                           - A few gastric polyps. Resected and retrieved x 3.                           - Normal examined duodenum. Biopsied. Recommendation:           - Patient has a contact number available for                            emergencies. The signs and symptoms of potential                            delayed complications were discussed with the                            patient. Return to normal activities tomorrow.                            Written discharge instructions were provided to the                            patient.                           - Resume previous diet.                           - Continue present  medications.                           - Await pathology results.                           - The findings and recommendations were discussed                            with the patient. Jackquline Denmark, MD 09/28/2021 4:14:34 PM This report has been signed electronically.

## 2021-09-28 NOTE — Progress Notes (Signed)
Chief Complaint: abdo pain, N/V  Referring Provider:  Street, Sharon Mt, *      ASSESSMENT AND PLAN;   #1. Episodic N/V/diarrhea. H/O wt loss is concerning. Neg CT AP 07/2021 Except for fatty liver  #2. + FIT  #3. RUQ pai. Neg [place] at [facility] recently. S/P laparoscopic cholecystectomy 02-28-2013 d/t biliary dyskinesia. Nl CBC, CMP 04/2021  Plan: -EGD/colon with miralax off plavix x 5 days (if OK with Dr Venetia Maxon) -Zofran 35m ODT Q 6hrs prn #20 -If still with problems, will give her a trial of Levsin.   I discussed EGD/Colonoscopy- the indications, risks, alternatives and potential complications including, but not limited to, bleeding, infection, reaction to medication, damage to internal organs, cardiac and/or pulmonary problems, and perforation requiring surgery (1 to 2 in 1000). The possibility that significant findings could be missed was explained. All ? were answered. The patient gives consent to proceed. HPI:    Samantha RAMAKRISHNANis a 75y.o. female  H/O CVA on plavix/ASA, HTN, HLD, anxiety/depression, history of seizures  With 3 discrete episodes of nausea/vomiting in 2March 01, 2021and 2 episodes in 203-01-2022with sudden onset of nausea/vomiting after eating certain foods like fish or eating out.  This lasts for about 12 hours and finally culminated into diarrhea.  She gets exhausted.  Next day she is absolutely normal.  She also has been having associated sharp RUQ pain with tenderness.  Had normal CBC with hemoglobin 13.2, MCV 86 on 04/2021.  She had normal CMP, hemoglobin A1c, normal TSH  She has been tested negative for any food allergies.  She also has right upper quadrant abdominal discomfort, gastroesophageal reflux.  She has been taking Protonix up to twice a day.  She denies having any excessive heartburn during the above episodes.  She had negative right upper quadrant ultrasound.  She had normal CBC, CMP and TSH previously.  She has lost some weight.  She was also found to be  positive for fit test 05/2019  Note that she is s/p cholecystectomy due to biliary dyskinesia in past.   Past GI procedures: EGD 22011-03-01by Dr. MLyda Jesterfor N/V/RUQ pain -Gastric polyps.  Otherwise normal EGD. Bx-fundic gland polyps.  Negative CLOtest.  EGD 06/2012 report not available -Fundic gland polyps -Negative small bowel biopsies for celiac.  Colonoscopy 05/2010 heme positive stools by Dr. MLyda Jester([facility]) -Early diverticular disease left colon -Small internal hemorrhoids    Wt Readings from Last 3 Encounters:  09/28/21 145 lb (65.8 kg)  08/15/21 145 lb 6 oz (65.9 kg)  02/08/21 153 lb (69.4 kg)   SH-her husband of 56years died of brain cancer in 202/29/20 she was the main caregiver.  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  Has 1 boy. 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     Past Surgical History:  [place]   [place]     CHOLECYSTECTOMY  2014   ELBOW SURGERY Right    KNEE SURGERY Right    locked left shoulder     SHOULDER SURGERY Right     Family History  Problem Relation Age of Onset   Heart attack Mother    Cancer Mother    Hypertension Mother    Heart attack Father    Heart disease Father    Hypertension Father    Seizures Sister    Hypertension Sister    Diabetes Brother    Kidney failure Brother     Hypertension Brother    Heart disease Brother    Colon cancer Neg Hx    Pancreatic cancer Neg Hx    Stomach cancer Neg Hx    Liver disease Neg Hx    Esophageal cancer Neg Hx     Social History   Tobacco Use   Smoking status: Former    Types: Cigarettes    Quit date: 12/08/1965    Years since quitting: 55.8   Smokeless tobacco: Never  Vaping Use   Vaping Use: Never used  Substance Use Topics   Alcohol use: No    Alcohol/week: 0.0 standard drinks   Drug use: No    Current Outpatient Medications  Medication Sig Dispense Refill   amLODipine (NORVASC) 5 MG tablet Take 1 tablet (5 mg total) by mouth daily. 90 tablet 3   APPLE CIDER VINEGAR PO Take by mouth.     ascorbic acid (VITAMIN C) 1000 MG tablet Take 1,000 mg by mouth daily.     Cetirizine HCl (ZYRTEC ALLERGY PO) Take 25 mg by mouth daily.     clopidogrel (PLAVIX) 75 MG tablet Take 1 tablet (75 mg total) by mouth daily. 90 tablet 3   ezetimibe (ZETIA) 10 MG tablet Take 1 tablet (10 mg total) by mouth daily. 90 tablet 3   LORazepam (ATIVAN) 1 MG tablet Take 1 mg by mouth 2 (two) times daily as needed.     MAGNESIUM PO Take 400 mg by mouth.     ondansetron (ZOFRAN ODT) 4 MG disintegrating tablet Take 1 tablet (4 mg total) by mouth every 6 (six) hours as needed for nausea or vomiting. 20 tablet 2   pantoprazole (PROTONIX) 40 MG tablet Take 40 mg by mouth daily.  3   pravastatin (PRAVACHOL) 20 MG tablet TAKE ONE TABLET BY MOUTH ON MONDAY AND FRIDAY 8 tablet 1   Current Facility-Administered Medications  Medication Dose Route Frequency Provider Last Rate Last Admin   0.9 %  sodium chloride infusion  500 mL Intravenous Once Jackquline Denmark, MD        Allergies  Allergen Reactions   Codeine Other (See Comments)    "shaky, jittery"  "makes my head feel funny"    Review of Systems:  Constitutional: Denies fever, chills, diaphoresis, appetite change and fatigue.  HEENT: Denies photophobia, eye pain, redness, hearing loss, ear  pain, congestion, sore throat, rhinorrhea, sneezing, mouth sores, neck pain, neck stiffness and tinnitus.   Respiratory: Denies SOB, DOE, cough, chest tightness,  and wheezing.   Cardiovascular: Denies chest pain, palpitations and leg swelling.  Genitourinary: Denies dysuria, urgency, frequency, hematuria, flank pain and difficulty urinating.  Musculoskeletal: Denies myalgias, back pain, joint swelling, arthralgias and gait problem.  Skin: No rash.  Neurological: Denies dizziness, seizures, syncope, weakness, light-headedness, numbness and headaches.  Hematological: Denies adenopathy. Easy bruising, personal or family bleeding history  Psychiatric/Behavioral: has anxiety  or depression.  Has been having sleeping problems.     Physical Exam:    BP 136/76   Pulse 72   Temp 97.7 F (36.5 C) (Temporal)   Ht 5' 3"  (1.6 m)   Wt 145 lb (65.8 kg)   SpO2 97%   BMI 25.69 kg/m  Wt Readings from Last 3 Encounters:  09/28/21 145 lb (65.8 kg)  08/15/21 145 lb 6 oz (65.9 kg)  02/08/21 153 lb (69.4 kg)   Constitutional:  Well-developed, in no acute distress. Psychiatric: Normal mood and affect. Behavior is normal. HEENT: Pupils normal.  Conjunctivae are normal. No scleral icterus. Neck supple.  Cardiovascular: Normal rate, regular rhythm. No edema Pulmonary/chest: Effort normal and breath sounds normal. No wheezing, rales or rhonchi. Abdominal: Soft, nondistended. Nontender. Bowel sounds active throughout. There are no masses palpable. No hepatomegaly. Rectal: Deferred Neurological: Alert and oriented to person place and time. Skin: Skin is warm and dry. No rashes noted.  Data Reviewed: I have personally reviewed following labs and imaging studies  CBC: CBC Latest Ref Rng & Units 08/15/2021 12/13/2015 12/12/2015  WBC 4.0 - 10.5 K/uL 6.8 7.8 7.7  Hemoglobin 12.0 - 15.0 g/dL 13.5 9.5(L) 9.8(L)  Hematocrit 36.0 - 46.0 % 39.4 28.2(L) 29.3(L)  Platelets 150.0 - 400.0 K/uL 304.0 280 299     CMP: CMP Latest Ref Rng & Units 08/15/2021 02/08/2021 01/05/2020  Glucose 70 - 99 mg/dL 77 100(H) 93  BUN 6 - 23 mg/dL 14 18 14   Creatinine 0.40 - 1.20 mg/dL 0.83 0.83 0.71  Sodium 135 - 145 mEq/L 139 142 139  Potassium 3.5 - 5.1 mEq/L 4.5 4.0 4.2  Chloride 96 - 112 mEq/L 102 102 102  CO2 19 - 32 mEq/L 28 23 23   Calcium 8.4 - 10.5 mg/dL 10.2 9.6 9.5  Total Protein 6.0 - 8.3 g/dL 8.0 7.4 7.8  Total Bilirubin 0.2 - 1.2 mg/dL 0.4 0.2 0.4  Alkaline Phos 39 - 117 U/L 77 87 76  AST 0 - 37 U/L 39(H) 42(H) 45(H)  ALT 0 - 35 U/L 56(H) 49(H) 59(H)       Carmell Austria, MD 09/28/2021, 3:10 PM  Cc: Street, Sharon Mt, *

## 2021-10-02 ENCOUNTER — Other Ambulatory Visit: Payer: Self-pay | Admitting: Cardiology

## 2021-10-02 ENCOUNTER — Telehealth: Payer: Self-pay | Admitting: *Deleted

## 2021-10-02 NOTE — Telephone Encounter (Signed)
  Follow up Call-  Call back number 09/28/2021  Post procedure Call Back phone  # [phone]  Permission to leave phone message Yes  Some recent data might be hidden     Patient questions:  Do you have a fever, pain , or abdominal swelling? No. Pain Score  0 *  Have you tolerated food without any problems? Yes.    Have you been able to return to your normal activities? Yes.    Do you have any questions about your discharge instructions: Diet   No. Medications  No. Follow up visit  No.  Do you have questions or concerns about your Care? No.  Actions: * If pain score is 4 or above: No action needed, pain <4.  Have you developed a fever since your procedure? no  2.   Have you had an respiratory symptoms (SOB or cough) since your procedure? no  3.   Have you tested positive for COVID 19 since your procedure no  4.   Have you had any family members/close contacts diagnosed with the COVID 19 since your procedure?  no   If yes to any of these questions please route to Joylene John, RN and Joella Prince, RN

## 2021-10-06 DIAGNOSIS — G4700 Insomnia, unspecified: Secondary | ICD-10-CM | POA: Diagnosis not present

## 2021-10-06 DIAGNOSIS — F419 Anxiety disorder, unspecified: Secondary | ICD-10-CM | POA: Diagnosis not present

## 2021-10-09 ENCOUNTER — Encounter: Payer: Self-pay | Admitting: Gastroenterology

## 2021-10-09 DIAGNOSIS — F4323 Adjustment disorder with mixed anxiety and depressed mood: Secondary | ICD-10-CM | POA: Diagnosis not present

## 2021-10-09 DIAGNOSIS — F419 Anxiety disorder, unspecified: Secondary | ICD-10-CM | POA: Diagnosis not present

## 2021-10-19 DIAGNOSIS — M5030 Other cervical disc degeneration, unspecified cervical region: Secondary | ICD-10-CM | POA: Diagnosis not present

## 2021-10-19 DIAGNOSIS — M9901 Segmental and somatic dysfunction of cervical region: Secondary | ICD-10-CM | POA: Diagnosis not present

## 2021-11-07 DIAGNOSIS — M9901 Segmental and somatic dysfunction of cervical region: Secondary | ICD-10-CM | POA: Diagnosis not present

## 2021-11-07 DIAGNOSIS — M5030 Other cervical disc degeneration, unspecified cervical region: Secondary | ICD-10-CM | POA: Diagnosis not present

## 2021-11-14 DIAGNOSIS — Z23 Encounter for immunization: Secondary | ICD-10-CM | POA: Diagnosis not present

## 2021-11-15 DIAGNOSIS — I679 Cerebrovascular disease, unspecified: Secondary | ICD-10-CM | POA: Diagnosis not present

## 2021-11-15 DIAGNOSIS — Z8673 Personal history of transient ischemic attack (TIA), and cerebral infarction without residual deficits: Secondary | ICD-10-CM | POA: Diagnosis not present

## 2021-11-16 DIAGNOSIS — I10 Essential (primary) hypertension: Secondary | ICD-10-CM | POA: Diagnosis not present

## 2021-11-16 DIAGNOSIS — I700 Atherosclerosis of aorta: Secondary | ICD-10-CM | POA: Diagnosis not present

## 2021-11-16 DIAGNOSIS — K2960 Other gastritis without bleeding: Secondary | ICD-10-CM | POA: Diagnosis not present

## 2021-11-16 DIAGNOSIS — I672 Cerebral atherosclerosis: Secondary | ICD-10-CM | POA: Diagnosis not present

## 2021-11-16 DIAGNOSIS — F4381 Prolonged grief disorder: Secondary | ICD-10-CM | POA: Diagnosis not present

## 2021-11-16 DIAGNOSIS — E785 Hyperlipidemia, unspecified: Secondary | ICD-10-CM | POA: Diagnosis not present

## 2021-11-16 DIAGNOSIS — I8310 Varicose veins of unspecified lower extremity with inflammation: Secondary | ICD-10-CM | POA: Diagnosis not present

## 2021-11-16 DIAGNOSIS — G4700 Insomnia, unspecified: Secondary | ICD-10-CM | POA: Diagnosis not present

## 2021-11-16 DIAGNOSIS — I6930 Unspecified sequelae of cerebral infarction: Secondary | ICD-10-CM | POA: Diagnosis not present

## 2021-11-17 DIAGNOSIS — G4700 Insomnia, unspecified: Secondary | ICD-10-CM | POA: Diagnosis not present

## 2021-11-17 DIAGNOSIS — F419 Anxiety disorder, unspecified: Secondary | ICD-10-CM | POA: Diagnosis not present

## 2021-11-28 DIAGNOSIS — Z1231 Encounter for screening mammogram for malignant neoplasm of breast: Secondary | ICD-10-CM | POA: Diagnosis not present

## 2021-11-30 DIAGNOSIS — L03031 Cellulitis of right toe: Secondary | ICD-10-CM | POA: Diagnosis not present

## 2021-12-04 DIAGNOSIS — F419 Anxiety disorder, unspecified: Secondary | ICD-10-CM | POA: Diagnosis not present

## 2021-12-14 DIAGNOSIS — L600 Ingrowing nail: Secondary | ICD-10-CM | POA: Diagnosis not present

## 2021-12-19 DIAGNOSIS — Z23 Encounter for immunization: Secondary | ICD-10-CM | POA: Diagnosis not present

## 2021-12-19 DIAGNOSIS — S93412A Sprain of calcaneofibular ligament of left ankle, initial encounter: Secondary | ICD-10-CM | POA: Diagnosis not present

## 2021-12-19 DIAGNOSIS — M25572 Pain in left ankle and joints of left foot: Secondary | ICD-10-CM | POA: Diagnosis not present

## 2022-01-18 DIAGNOSIS — G4700 Insomnia, unspecified: Secondary | ICD-10-CM | POA: Diagnosis not present

## 2022-01-18 DIAGNOSIS — F4323 Adjustment disorder with mixed anxiety and depressed mood: Secondary | ICD-10-CM | POA: Diagnosis not present

## 2022-01-30 DIAGNOSIS — M9901 Segmental and somatic dysfunction of cervical region: Secondary | ICD-10-CM | POA: Diagnosis not present

## 2022-01-30 DIAGNOSIS — M5030 Other cervical disc degeneration, unspecified cervical region: Secondary | ICD-10-CM | POA: Diagnosis not present

## 2022-02-12 DIAGNOSIS — F419 Anxiety disorder, unspecified: Secondary | ICD-10-CM | POA: Diagnosis not present

## 2022-03-12 DIAGNOSIS — M9901 Segmental and somatic dysfunction of cervical region: Secondary | ICD-10-CM | POA: Diagnosis not present

## 2022-03-12 DIAGNOSIS — M5030 Other cervical disc degeneration, unspecified cervical region: Secondary | ICD-10-CM | POA: Diagnosis not present

## 2022-03-14 DIAGNOSIS — J209 Acute bronchitis, unspecified: Secondary | ICD-10-CM | POA: Diagnosis not present

## 2022-03-14 DIAGNOSIS — J0100 Acute maxillary sinusitis, unspecified: Secondary | ICD-10-CM | POA: Diagnosis not present

## 2022-03-17 ENCOUNTER — Other Ambulatory Visit: Payer: Self-pay | Admitting: Cardiology

## 2022-03-24 NOTE — Progress Notes (Signed)
?Cardiology Office Note:   ? ?Date:  04/02/2022  ? ?ID:  Samantha Wilkinson, DOB 1946-08-26, MRN [id number] ? ?PCP:  Street, Sharon Mt, MD  ?Cardiologist:  Shirlee More, MD   ? ?Referring MD: Street, Sharon Mt, *  ? ? ?ASSESSMENT:   ? ?1. Mixed hyperlipidemia   ?2. Essential hypertension   ?3. Cerebrovascular disease   ? ?PLAN:   ? ?In order of problems listed above: ? ?Continue current lipid-lowering low-dose low intensity statin and Zetia recheck profile CMP.  If LDL is not at target consider bempedoic acid she is not tolerant of high intensity statins well visit thank you ?Stable BP at target continue her amlodipine ?She has had no imaging in view of her previous stroke we will do a cerebrovascular duplex if severe flow-limiting stenosis would benefit from revascularization ? ? ?Next appointment: 1 year ? ? ?Medication Adjustments/Labs and Tests Ordered: ?Current medicines are reviewed at length with the patient today.  Concerns regarding medicines are outlined above.  ?No orders of the defined types were placed in this encounter. ? ?No orders of the defined types were placed in this encounter. ? ? ?Chief Complaint  ?Patient presents with  ? Follow-up  ? Hyperlipidemia  ? ? ?History of Present Illness:   ? ?Samantha Wilkinson is a 76 y.o. female with a hx of stroke hypertension and hyperlipidemia with poor statin intolerance last seen 02/08/2021.  She is on low intensity statin and Zetia 2 left occipital stroke in 2015 ? ?Compliance with diet, lifestyle and medications: Yes ? ?She continues to do well mostly inside of the anniversary of her husband's death ?Her weight is up 13 pounds and she is committed to weight loss ?She tolerates low-dose without low intensity statin and Zetia we will recheck her lipid profile today since she is short ?He is having no muscle pain or weakness. ?She has had no TIA chest pain palpitation or syncope ? ?Most recent labs 05/22/2021: ?Cholesterol 202 LDL 113 triglycerides 165 HDL 49 A1c  5.8% ?Past Medical History:  ?Diagnosis Date  ? Acute left arterial ischemic stroke, ICA (internal carotid artery) ([place]) 11/12/2014  ? Altered mental status 12/08/2015  ? Anxiety   ? Aphasia 01/19/2016  ? Cerebrovascular disease 03/31/2018  ? Convulsions ([place])   ? Depression   ? Depression with anxiety 12/08/2015  ? Encephalopathy 12/08/2015  ? History of stroke 03/31/2018  ? Hyperlipidemia   ? Hypertension   ? Hyponatremia 12/08/2015  ? Lactic acid acidosis 12/08/2015  ? Migraine 11/12/2014  ? Psychogenic polydipsia 12/08/2015  ? Seizure ([place]) 01/19/2016  ? Stroke [facility]) october 2015  ? Ulcerative colitis ([place])   ? ? ?Past Surgical History:  ?Procedure Laterality Date  ? ABDOMINAL HYSTERECTOMY  1988  ? APPENDECTOMY  1988  ? BACK SURGERY    ? CHOLECYSTECTOMY  2014  ? COLOSTOMY    ? ELBOW SURGERY Right   ? KNEE SURGERY Right   ? locked left shoulder    ? SHOULDER SURGERY Right   ? UPPER GASTROINTESTINAL ENDOSCOPY    ? ? ?Current Medications: ?Current Meds  ?Medication Sig  ? amLODipine (NORVASC) 5 MG tablet Take 1 tablet (5 mg total) by mouth daily.  ? APPLE CIDER VINEGAR PO Take by mouth.  ? ascorbic acid (VITAMIN C) 1000 MG tablet Take 1,000 mg by mouth daily.  ? Cetirizine HCl (ZYRTEC ALLERGY PO) Take 25 mg by mouth daily.  ? clopidogrel (PLAVIX) 75 MG tablet Take 1 tablet (75 mg total)  by mouth daily.  ? ezetimibe (ZETIA) 10 MG tablet Take 1 tablet (10 mg total) by mouth daily. Patient must keep appointment on 04/02/22 for further refills. 1 st attempt  ? LORazepam (ATIVAN) 1 MG tablet Take 1 mg by mouth 2 (two) times daily as needed.  ? MAGNESIUM PO Take 400 mg by mouth.  ? pantoprazole (PROTONIX) 40 MG tablet Take 40 mg by mouth daily.  ? pravastatin (PRAVACHOL) 20 MG tablet TAKE ONE TABLET BY MOUTH ON MONDAY AND FRIDAY (Patient taking differently: TAKE ONE TABLET BY MOUTH ON Monday , Wednesday and Friday)  ?  ? ?Allergies:   Codeine  ? ?Social History  ? ?Socioeconomic History  ? Marital status: Widowed  ?  Spouse name: Not  on file  ? Number of children: 1  ? Years of education: 8  ? Highest education level: Not on file  ?Occupational History  ? Occupation: retired  ?Tobacco Use  ? Smoking status: Former  ?  Types: Cigarettes  ?  Quit date: 12/08/1965  ?  Years since quitting: 56.3  ?  Passive exposure: Past  ? Smokeless tobacco: Never  ?Vaping Use  ? Vaping Use: Never used  ?Substance and Sexual Activity  ? Alcohol use: No  ?  Alcohol/week: 0.0 standard drinks  ? Drug use: No  ? Sexual activity: Not on file  ?Other Topics Concern  ? Not on file  ?Social History Narrative  ? Patient drinks caffeine occasionally.   ? Patient is right handed.   ? ?Social Determinants of Health  ? ?Financial Resource Strain: Not on file  ?Food Insecurity: Not on file  ?Transportation Needs: Not on file  ?Physical Activity: Not on file  ?Stress: Not on file  ?Social Connections: Not on file  ?  ? ?Family History: ?The patient's family history includes Cancer in her mother; Diabetes in her brother; Heart attack in her father and mother; Heart disease in her brother and father; Hypertension in her brother, father, mother, and sister; Kidney failure in her brother; Seizures in her sister. There is no history of Colon cancer, Pancreatic cancer, Stomach cancer, Liver disease, or Esophageal cancer. ?ROS:   ?Please see the history of present illness.    ?All other systems reviewed and are negative. ? ?EKGs/Labs/Other Studies Reviewed:   ? ?The following studies were reviewed today: ? ?EKG:  EKG ordered today and personally reviewed.  The ekg ordered today demonstrates sinus rhythm normal ? ?Recent Labs: ?08/15/2021: ALT 56; BUN 14; Creatinine, Ser 0.83; Hemoglobin 13.5; Platelets 304.0; Potassium 4.5; Sodium 139  ?Recent Lipid Panel ?   ?Component Value Date/Time  ? CHOL 196 02/08/2021 1506  ? TRIG 206 (H) 02/08/2021 1506  ? HDL 47 02/08/2021 1506  ? CHOLHDL 4.2 02/08/2021 1506  ? CHOLHDL 2.6 12/09/2015 0409  ? VLDL 9 12/09/2015 0409  ? LDLCALC 113 (H) 02/08/2021  1506  ? ? ?Physical Exam:   ? ?VS:  BP (!) 146/84 (BP Location: Right Arm)   Pulse 80   Ht 5' 3"  (1.6 m)   Wt 152 lb 3.2 oz (69 kg)   SpO2 96%   BMI 26.96 kg/m?    ? ?Wt Readings from Last 3 Encounters:  ?04/02/22 152 lb 3.2 oz (69 kg)  ?09/28/21 145 lb (65.8 kg)  ?08/15/21 145 lb 6 oz (65.9 kg)  ?  ? ?GEN:  Well nourished, well developed in no acute distress ?HEENT: Normal ?NECK: No JVD; No carotid bruits ?LYMPHATICS: No lymphadenopathy ?CARDIAC: RRR, no  murmurs, rubs, gallops ?RESPIRATORY:  Clear to auscultation without rales, wheezing or rhonchi  ?ABDOMEN: Soft, non-tender, non-distended ?MUSCULOSKELETAL:  No edema; No deformity  ?SKIN: Warm and dry ?NEUROLOGIC:  Alert and oriented x 3 ?PSYCHIATRIC:  Normal affect  ? ? ?Signed, ?Shirlee More, MD  ?04/02/2022 4:49 PM    ?[place]  ?

## 2022-04-02 ENCOUNTER — Encounter: Payer: Self-pay | Admitting: Cardiology

## 2022-04-02 ENCOUNTER — Ambulatory Visit (INDEPENDENT_AMBULATORY_CARE_PROVIDER_SITE_OTHER): Payer: Medicare PPO | Admitting: Cardiology

## 2022-04-02 VITALS — BP 146/84 | HR 80 | Ht 63.0 in | Wt 152.2 lb

## 2022-04-02 DIAGNOSIS — I679 Cerebrovascular disease, unspecified: Secondary | ICD-10-CM | POA: Diagnosis not present

## 2022-04-02 DIAGNOSIS — E782 Mixed hyperlipidemia: Secondary | ICD-10-CM | POA: Diagnosis not present

## 2022-04-02 DIAGNOSIS — I10 Essential (primary) hypertension: Secondary | ICD-10-CM | POA: Diagnosis not present

## 2022-04-02 MED ORDER — ASPIRIN EC 81 MG PO TBEC: 81.0000 mg | DELAYED_RELEASE_TABLET | Freq: Every day | ORAL | 3 refills | Status: AC

## 2022-04-02 NOTE — Patient Instructions (Signed)
Medication Instructions:  ?Your physician has recommended you make the following change in your medication:  ? ?Decrease your aspirin to 81 mg daily. ? ?*If you need a refill on your cardiac medications before your next appointment, please call your pharmacy* ? ? ?Lab Work: ?Your physician recommends that you have a CMP and lipids done in the office. ? ?If you have labs (blood work) drawn today and your tests are completely normal, you will receive your results only by: ?MyChart Message (if you have MyChart) OR ?A paper copy in the mail ?If you have any lab test that is abnormal or we need to change your treatment, we will call you to review the results. ? ? ?Testing/Procedures: ?Your physician has requested that you have a carotid duplex. This test is an ultrasound of the carotid arteries in your neck. It looks at blood flow through these arteries that supply the brain with blood. Allow one hour for this exam. There are no restrictions or special instructions.  ? ? ?Follow-Up: ?At [facility], you and your health needs are our priority.  As part of our continuing mission to provide you with exceptional heart care, we have created designated Provider Care Teams.  These Care Teams include your primary Cardiologist (physician) and Advanced Practice Providers (APPs -  Physician Assistants and Nurse Practitioners) who all work together to provide you with the care you need, when you need it. ? ?We recommend signing up for the patient portal called "MyChart".  Sign up information is provided on this After Visit Summary.  MyChart is used to connect with patients for Virtual Visits (Telemedicine).  Patients are able to view lab/test results, encounter notes, upcoming appointments, etc.  Non-urgent messages can be sent to your provider as well.   ?To learn more about what you can do with MyChart, go to [email or web address].   ? ?Your next appointment:   ?12 month(s) ? ?The format for your next appointment:   ?In  Person ? ?Provider:   ?Shirlee More, MD ? ? ?Other Instructions ?NA  ?

## 2022-04-03 LAB — LIPID PANEL
Chol/HDL Ratio: 3.7 ratio (ref 0.0–4.4)
Cholesterol, Total: 210 mg/dL — ABNORMAL HIGH (ref 100–199)
HDL: 57 mg/dL (ref >39)
LDL Chol Calc (NIH): 113 mg/dL — ABNORMAL HIGH (ref 0–99)
Triglycerides: 232 mg/dL — ABNORMAL HIGH (ref 0–149)
VLDL Cholesterol Cal: 40 mg/dL (ref 5–40)

## 2022-04-03 LAB — COMPREHENSIVE METABOLIC PANEL
ALT: 55 IU/L — ABNORMAL HIGH (ref 0–32)
AST: 44 IU/L — ABNORMAL HIGH (ref 0–40)
Albumin/Globulin Ratio: 1.9 (ref 1.2–2.2)
Albumin: 5 g/dL — ABNORMAL HIGH (ref 3.7–4.7)
Alkaline Phosphatase: 80 IU/L (ref 44–121)
BUN/Creatinine Ratio: 21 (ref 12–28)
BUN: 16 mg/dL (ref 8–27)
Bilirubin Total: 0.3 mg/dL (ref 0.0–1.2)
CO2: 23 mmol/L (ref 20–29)
Calcium: 10 mg/dL (ref 8.7–10.3)
Chloride: 102 mmol/L (ref 96–106)
Creatinine, Ser: 0.77 mg/dL (ref 0.57–1.00)
Globulin, Total: 2.7 g/dL (ref 1.5–4.5)
Glucose: 76 mg/dL (ref 70–99)
Potassium: 4.2 mmol/L (ref 3.5–5.2)
Sodium: 141 mmol/L (ref 134–144)
Total Protein: 7.7 g/dL (ref 6.0–8.5)
eGFR: 81 mL/min/{1.73_m2} (ref >59)

## 2022-04-04 ENCOUNTER — Telehealth: Payer: Self-pay

## 2022-04-04 NOTE — Telephone Encounter (Signed)
-----   Message from Richardo Priest, MD sent at 04/03/2022  9:03 AM EDT ----- ?Regarding: FW: ?Stable no changes in treatment ?----- Message ----- ?From: Interface, [facility] Lab Results In ?Sent: 04/03/2022   5:38 AM EDT ?To: Richardo Priest, MD ? ?

## 2022-04-04 NOTE — Telephone Encounter (Signed)
Patient came by the office and was notified of results ?

## 2022-04-10 ENCOUNTER — Ambulatory Visit (INDEPENDENT_AMBULATORY_CARE_PROVIDER_SITE_OTHER): Payer: Medicare PPO

## 2022-04-10 ENCOUNTER — Telehealth: Payer: Self-pay | Admitting: Cardiology

## 2022-04-10 DIAGNOSIS — I6789 Other cerebrovascular disease: Secondary | ICD-10-CM

## 2022-04-10 DIAGNOSIS — I679 Cerebrovascular disease, unspecified: Secondary | ICD-10-CM

## 2022-04-10 NOTE — Telephone Encounter (Signed)
?  Pt is returning call to get carotid result ?

## 2022-04-11 NOTE — Telephone Encounter (Signed)
Patient informed of results.  

## 2022-04-16 DIAGNOSIS — M5030 Other cervical disc degeneration, unspecified cervical region: Secondary | ICD-10-CM | POA: Diagnosis not present

## 2022-04-16 DIAGNOSIS — M9901 Segmental and somatic dysfunction of cervical region: Secondary | ICD-10-CM | POA: Diagnosis not present

## 2022-04-19 DIAGNOSIS — F4323 Adjustment disorder with mixed anxiety and depressed mood: Secondary | ICD-10-CM | POA: Diagnosis not present

## 2022-04-19 DIAGNOSIS — F419 Anxiety disorder, unspecified: Secondary | ICD-10-CM | POA: Diagnosis not present

## 2022-04-19 DIAGNOSIS — G4700 Insomnia, unspecified: Secondary | ICD-10-CM | POA: Diagnosis not present

## 2022-05-07 ENCOUNTER — Other Ambulatory Visit: Payer: Self-pay | Admitting: Cardiology

## 2022-05-07 NOTE — Telephone Encounter (Signed)
Zetia 10 mg # 90 x 3 refills sent to  [place], [place] ?

## 2022-05-21 DIAGNOSIS — M5030 Other cervical disc degeneration, unspecified cervical region: Secondary | ICD-10-CM | POA: Diagnosis not present

## 2022-05-21 DIAGNOSIS — M9901 Segmental and somatic dysfunction of cervical region: Secondary | ICD-10-CM | POA: Diagnosis not present

## 2022-05-24 DIAGNOSIS — M859 Disorder of bone density and structure, unspecified: Secondary | ICD-10-CM | POA: Diagnosis not present

## 2022-05-24 DIAGNOSIS — E785 Hyperlipidemia, unspecified: Secondary | ICD-10-CM | POA: Diagnosis not present

## 2022-05-24 DIAGNOSIS — I700 Atherosclerosis of aorta: Secondary | ICD-10-CM | POA: Diagnosis not present

## 2022-05-24 DIAGNOSIS — I672 Cerebral atherosclerosis: Secondary | ICD-10-CM | POA: Diagnosis not present

## 2022-05-24 DIAGNOSIS — I10 Essential (primary) hypertension: Secondary | ICD-10-CM | POA: Diagnosis not present

## 2022-05-24 DIAGNOSIS — Z0000 Encounter for general adult medical examination without abnormal findings: Secondary | ICD-10-CM | POA: Diagnosis not present

## 2022-05-24 DIAGNOSIS — I6930 Unspecified sequelae of cerebral infarction: Secondary | ICD-10-CM | POA: Diagnosis not present

## 2022-05-24 DIAGNOSIS — G43109 Migraine with aura, not intractable, without status migrainosus: Secondary | ICD-10-CM | POA: Diagnosis not present

## 2022-05-24 DIAGNOSIS — R7302 Impaired glucose tolerance (oral): Secondary | ICD-10-CM | POA: Diagnosis not present

## 2022-06-11 DIAGNOSIS — F4323 Adjustment disorder with mixed anxiety and depressed mood: Secondary | ICD-10-CM | POA: Diagnosis not present

## 2022-06-18 DIAGNOSIS — M9901 Segmental and somatic dysfunction of cervical region: Secondary | ICD-10-CM | POA: Diagnosis not present

## 2022-06-18 DIAGNOSIS — M5030 Other cervical disc degeneration, unspecified cervical region: Secondary | ICD-10-CM | POA: Diagnosis not present

## 2022-06-20 DIAGNOSIS — L814 Other melanin hyperpigmentation: Secondary | ICD-10-CM | POA: Diagnosis not present

## 2022-06-20 DIAGNOSIS — D225 Melanocytic nevi of trunk: Secondary | ICD-10-CM | POA: Diagnosis not present

## 2022-06-20 DIAGNOSIS — L820 Inflamed seborrheic keratosis: Secondary | ICD-10-CM | POA: Diagnosis not present

## 2022-06-20 DIAGNOSIS — L578 Other skin changes due to chronic exposure to nonionizing radiation: Secondary | ICD-10-CM | POA: Diagnosis not present

## 2022-07-16 DIAGNOSIS — F4323 Adjustment disorder with mixed anxiety and depressed mood: Secondary | ICD-10-CM | POA: Diagnosis not present

## 2022-07-16 DIAGNOSIS — G4700 Insomnia, unspecified: Secondary | ICD-10-CM | POA: Diagnosis not present

## 2022-07-16 DIAGNOSIS — F419 Anxiety disorder, unspecified: Secondary | ICD-10-CM | POA: Diagnosis not present

## 2022-07-19 DIAGNOSIS — F419 Anxiety disorder, unspecified: Secondary | ICD-10-CM | POA: Diagnosis not present

## 2022-07-19 DIAGNOSIS — F4323 Adjustment disorder with mixed anxiety and depressed mood: Secondary | ICD-10-CM | POA: Diagnosis not present

## 2022-08-15 DIAGNOSIS — F4323 Adjustment disorder with mixed anxiety and depressed mood: Secondary | ICD-10-CM | POA: Diagnosis not present

## 2022-08-29 DIAGNOSIS — M5030 Other cervical disc degeneration, unspecified cervical region: Secondary | ICD-10-CM | POA: Diagnosis not present

## 2022-08-29 DIAGNOSIS — M9901 Segmental and somatic dysfunction of cervical region: Secondary | ICD-10-CM | POA: Diagnosis not present

## 2022-09-26 DIAGNOSIS — M5030 Other cervical disc degeneration, unspecified cervical region: Secondary | ICD-10-CM | POA: Diagnosis not present

## 2022-09-26 DIAGNOSIS — M9901 Segmental and somatic dysfunction of cervical region: Secondary | ICD-10-CM | POA: Diagnosis not present

## 2022-10-02 DIAGNOSIS — Z23 Encounter for immunization: Secondary | ICD-10-CM | POA: Diagnosis not present

## 2022-10-22 DIAGNOSIS — F4323 Adjustment disorder with mixed anxiety and depressed mood: Secondary | ICD-10-CM | POA: Diagnosis not present

## 2022-10-24 DIAGNOSIS — M5030 Other cervical disc degeneration, unspecified cervical region: Secondary | ICD-10-CM | POA: Diagnosis not present

## 2022-10-24 DIAGNOSIS — M9901 Segmental and somatic dysfunction of cervical region: Secondary | ICD-10-CM | POA: Diagnosis not present

## 2022-11-19 DIAGNOSIS — M5030 Other cervical disc degeneration, unspecified cervical region: Secondary | ICD-10-CM | POA: Diagnosis not present

## 2022-11-19 DIAGNOSIS — M9901 Segmental and somatic dysfunction of cervical region: Secondary | ICD-10-CM | POA: Diagnosis not present

## 2022-12-17 DIAGNOSIS — M5030 Other cervical disc degeneration, unspecified cervical region: Secondary | ICD-10-CM | POA: Diagnosis not present

## 2022-12-17 DIAGNOSIS — M9901 Segmental and somatic dysfunction of cervical region: Secondary | ICD-10-CM | POA: Diagnosis not present

## 2022-12-28 ENCOUNTER — Telehealth: Payer: Self-pay

## 2022-12-28 NOTE — Patient Outreach (Signed)
  Care Coordination   12/28/2022 Name: Samantha Wilkinson MRN: [id number] DOB: 12-24-1946   Care Coordination Outreach Attempts:  An unsuccessful telephone outreach was attempted today to offer the patient information about available care coordination services as a benefit of their health plan.   Follow Up Plan:  Additional outreach attempts will be made to offer the patient care coordination information and services.   Encounter Outcome:  No Answer   Care Coordination Interventions:  No, not indicated    Tomasa Rand, RN, BSN, [facility] [employer] [phone]

## 2023-01-01 DIAGNOSIS — Z1231 Encounter for screening mammogram for malignant neoplasm of breast: Secondary | ICD-10-CM | POA: Diagnosis not present

## 2023-01-04 ENCOUNTER — Telehealth: Payer: Self-pay

## 2023-01-04 NOTE — Patient Outreach (Signed)
  Care Coordination   01/04/2023 Name: Samantha Wilkinson MRN: [id number] DOB: May 28, 1946   Care Coordination Outreach Attempts:  A second unsuccessful outreach was attempted today to offer the patient with information about available care coordination services as a benefit of their health plan.     Follow Up Plan:  Additional outreach attempts will be made to offer the patient care coordination information and services.   Encounter Outcome:  No Answer   Care Coordination Interventions:  No, not indicated    Tomasa Rand, RN, BSN, [facility] [employer] [phone]

## 2023-01-07 ENCOUNTER — Telehealth: Payer: Self-pay

## 2023-01-07 NOTE — Patient Outreach (Signed)
  Care Coordination   01/07/2023 Name: Samantha Wilkinson MRN: [id number] DOB: [birth year]   Care Coordination Outreach Attempts:  A third unsuccessful outreach was attempted today to offer the patient with information about available care coordination services as a benefit of their health plan.   Follow Up Plan:  No further outreach attempts will be made at this time. We have been unable to contact the patient to offer or enroll patient in care coordination services  Encounter Outcome:  No Answer   Care Coordination Interventions:  No, not indicated    Tomasa Rand, RN, BSN, [facility] [place] Coordinator [phone]

## 2023-01-08 DIAGNOSIS — G4700 Insomnia, unspecified: Secondary | ICD-10-CM | POA: Diagnosis not present

## 2023-01-08 DIAGNOSIS — F419 Anxiety disorder, unspecified: Secondary | ICD-10-CM | POA: Diagnosis not present

## 2023-01-08 DIAGNOSIS — F4323 Adjustment disorder with mixed anxiety and depressed mood: Secondary | ICD-10-CM | POA: Diagnosis not present

## 2023-01-09 DIAGNOSIS — S6291XA Unspecified fracture of right wrist and hand, initial encounter for closed fracture: Secondary | ICD-10-CM | POA: Diagnosis not present

## 2023-01-14 DIAGNOSIS — F419 Anxiety disorder, unspecified: Secondary | ICD-10-CM | POA: Diagnosis not present

## 2023-01-14 DIAGNOSIS — M25531 Pain in right wrist: Secondary | ICD-10-CM | POA: Diagnosis not present

## 2023-01-14 DIAGNOSIS — S8002XA Contusion of left knee, initial encounter: Secondary | ICD-10-CM | POA: Diagnosis not present

## 2023-01-21 DIAGNOSIS — M5030 Other cervical disc degeneration, unspecified cervical region: Secondary | ICD-10-CM | POA: Diagnosis not present

## 2023-01-21 DIAGNOSIS — M9901 Segmental and somatic dysfunction of cervical region: Secondary | ICD-10-CM | POA: Diagnosis not present

## 2023-01-22 DIAGNOSIS — S52514A Nondisplaced fracture of right radial styloid process, initial encounter for closed fracture: Secondary | ICD-10-CM | POA: Diagnosis not present

## 2023-02-19 DIAGNOSIS — S52514A Nondisplaced fracture of right radial styloid process, initial encounter for closed fracture: Secondary | ICD-10-CM | POA: Diagnosis not present

## 2023-02-25 DIAGNOSIS — M5030 Other cervical disc degeneration, unspecified cervical region: Secondary | ICD-10-CM | POA: Diagnosis not present

## 2023-02-25 DIAGNOSIS — M9901 Segmental and somatic dysfunction of cervical region: Secondary | ICD-10-CM | POA: Diagnosis not present

## 2023-04-02 ENCOUNTER — Encounter: Payer: Self-pay | Admitting: Cardiology

## 2023-04-02 ENCOUNTER — Encounter: Payer: Self-pay | Admitting: *Deleted

## 2023-04-03 NOTE — Progress Notes (Unsigned)
Cardiology Office Note:    Date:  04/04/2023   ID:  Samantha Wilkinson, DOB December 19, 1946, MRN [id number]  PCP:  Street, Sharon Mt, MD  Cardiologist:  Shirlee More, MD    Referring MD: [id number], Sharon Mt, *    ASSESSMENT:    1. Essential hypertension   2. Mixed hyperlipidemia   3. Statin intolerance   4. Cerebrovascular disease    PLAN:    In order of problems listed above:  BP is above target and ARB expectation is that her systolic is dropping range of treatment she will take daily for 2 weeks and see me next continue amlodipine Continue combined pravastatin 3 days a week poor statin tolerance and Zetia check lipid profile liver function Stable continue therapy including clopidogrel lipid-lowering treatment and antihypertensive Plan repeat carotid duplex in 1 year   Next appointment: 1 year   Medication Adjustments/Labs and Tests Ordered: Current medicines are reviewed at length with the patient today.  Concerns regarding medicines are outlined above.  No orders of the defined types were placed in this encounter.  No orders of the defined types were placed in this encounter.   Chief Complaint  Patient presents with   Follow-up   Hypertension    History of Present Illness:    Samantha Wilkinson is a 77 y.o. female with a hx of hypertension hyperlipidemia previous stroke and statin intolerance last seen 04/02/2022.  Carotid duplex 04/10/2022 showed no carotid artery stenosis.  Vertebral arteries and subclavian arteries were normal. Compliance with diet, lifestyle and medications: Yes  She has done well with no cardiovascular symptoms of edema shortness of breath chest pain palpitations syncope or TIA I reviewed her carotid duplex from last year clinically continuously wait a year or 2 before repeating She tolerates lipid-lowering therapy without muscle pain or weakness in her clopidogrel without bleeding bruising or GI side effects Ambulatory pressure in the morning in the  range of 145/71 intensify treatment and adding an ARB Past Medical History:  Diagnosis Date   Acute left arterial ischemic stroke, ICA (internal carotid artery) 11/12/2014   Altered mental status 12/08/2015   Anxiety    Aortic atherosclerosis    Aphasia 01/19/2016   Atherosclerotic cerebrovascular disease    Cerebrovascular disease 03/31/2018   Convulsions    Depression with anxiety 12/08/2015   Encephalopathy 12/08/2015   History of stroke 03/31/2018   Hyperlipidemia    Hypertension    Hyponatremia 12/08/2015   Lactic acid acidosis 12/08/2015   MDD (major depressive disorder), recurrent episode 07/27/2021   Migraine 11/12/2014   Mixed insomnia    Psychogenic polydipsia 12/08/2015   Reflux gastritis    Seizure 01/19/2016   Stroke 09/2014   Ulcerative colitis    Varicose veins with inflammation     Past Surgical History:  Procedure Laterality Date   [place]   Repair performed in 1996   CHOLECYSTECTOMY  2014   COLOSTOMY     ELBOW SURGERY Right    KNEE SURGERY Right    locked left shoulder     SHOULDER SURGERY Right 2004   UPPER GASTROINTESTINAL ENDOSCOPY      Current Medications: Current Meds  Medication Sig   amLODipine (NORVASC) 5 MG tablet Take 1 tablet (5 mg total) by mouth daily.   aspirin EC 81 MG tablet Take 1 tablet (81 mg total) by mouth daily. Swallow whole.   Cetirizine HCl (ZYRTEC ALLERGY PO)  Take 25 mg by mouth daily.   clopidogrel (PLAVIX) 75 MG tablet Take 1 tablet (75 mg total) by mouth daily.   ezetimibe (ZETIA) 10 MG tablet Take 1 tablet (10 mg total) by mouth daily.   LORazepam (ATIVAN) 1 MG tablet Take 1 mg by mouth 2 (two) times daily as needed.   MAGNESIUM PO Take 400 mg by mouth.   Multiple Vitamins-Minerals (ONE A DAY WOMEN 50 PLUS PO) Take 1 tablet by mouth daily.   pantoprazole (PROTONIX) 40 MG tablet Take 40 mg by mouth daily.   pravastatin (PRAVACHOL) 20 MG tablet Take 20  mg by mouth 3 (three) times a week.     Allergies:   Codeine   Social History   Socioeconomic History   Marital status: Widowed    Spouse name: Not on file   Number of children: 1   Years of education: 45   Highest education level: Not on file  Occupational History   Occupation: retired  Tobacco Use   Smoking status: Former    Types: Cigarettes    Quit date: 12/08/1965    Years since quitting: 57.3    Passive exposure: Past   Smokeless tobacco: Never  Vaping Use   Vaping Use: Never used  Substance and Sexual Activity   Alcohol use: No    Alcohol/week: 0.0 standard drinks of alcohol   Drug use: No   Sexual activity: Not on file  Other Topics Concern   Not on file  Social History Narrative   Patient drinks caffeine occasionally.    Patient is right handed.    Social Determinants of Health   Financial Resource Strain: Not on file  Food Insecurity: Not on file  Transportation Needs: Not on file  Physical Activity: Not on file  Stress: Not on file  Social Connections: Not on file     Family History: The patient's family history includes Cancer in her mother; Diabetes in her brother; Heart attack in her father and mother; Heart disease in her brother and father; Hypertension in her brother, father, mother, and sister; Kidney failure in her brother; Seizures in her sister. There is no history of Colon cancer, Pancreatic cancer, Stomach cancer, Liver disease, or Esophageal cancer. ROS:   Please see the history of present illness.    All other systems reviewed and are negative.  EKGs/Labs/Other Studies Reviewed:    The following studies were reviewed today:      EKG:  EKG ordered today and personally reviewed.  The ekg ordered today demonstrates sinus rhythm and is normal  Recent Labs: No results found for requested labs within last 365 days.  Recent Lipid Panel    Component Value Date/Time   CHOL 210 (H) 04/02/2022 1656   TRIG 232 (H) 04/02/2022 1656   HDL 57  04/02/2022 1656   CHOLHDL 3.7 04/02/2022 1656   CHOLHDL 2.6 12/09/2015 0409   VLDL 9 12/09/2015 0409   LDLCALC 113 (H) 04/02/2022 1656    Physical Exam:    VS:  BP (!) 148/82 (BP Location: Right Arm, Patient Position: Sitting)   Pulse 74   Ht 5\' 3"  (1.6 m)   Wt 152 lb (68.9 kg)   SpO2 97%   BMI 26.93 kg/m     Wt Readings from Last 3 Encounters:  04/04/23 152 lb (68.9 kg)  04/02/22 152 lb 3.2 oz (69 kg)  09/28/21 145 lb (65.8 kg)     GEN:  Well nourished, well developed in no acute distress HEENT:  Normal NECK: No JVD; No carotid bruits LYMPHATICS: No lymphadenopathy CARDIAC: RRR, no murmurs, rubs, gallops RESPIRATORY:  Clear to auscultation without rales, wheezing or rhonchi  ABDOMEN: Soft, non-tender, non-distended MUSCULOSKELETAL:  No edema; No deformity  SKIN: Warm and dry NEUROLOGIC:  Alert and oriented x 3 PSYCHIATRIC:  Normal affect    Signed, Shirlee More, MD  04/04/2023 9:05 AM    [place]

## 2023-04-04 ENCOUNTER — Ambulatory Visit: Payer: Medicare PPO | Attending: Cardiology | Admitting: Cardiology

## 2023-04-04 ENCOUNTER — Encounter: Payer: Self-pay | Admitting: Cardiology

## 2023-04-04 VITALS — BP 148/82 | HR 74 | Ht 63.0 in | Wt 152.0 lb

## 2023-04-04 DIAGNOSIS — I10 Essential (primary) hypertension: Secondary | ICD-10-CM | POA: Diagnosis not present

## 2023-04-04 DIAGNOSIS — E782 Mixed hyperlipidemia: Secondary | ICD-10-CM

## 2023-04-04 DIAGNOSIS — Z789 Other specified health status: Secondary | ICD-10-CM | POA: Diagnosis not present

## 2023-04-04 DIAGNOSIS — I679 Cerebrovascular disease, unspecified: Secondary | ICD-10-CM

## 2023-04-04 MED ORDER — VALSARTAN 80 MG PO TABS: 80.0000 mg | ORAL_TABLET | Freq: Every day | ORAL | 3 refills | Status: DC

## 2023-04-04 NOTE — Patient Instructions (Signed)
Medication Instructions:  Your physician has recommended you make the following change in your medication:   START: Valsartan 80 mg daily  *If you need a refill on your cardiac medications before your next appointment, please call your pharmacy*   Lab Work: Your physician recommends that you return for lab work in:   Labs today: CMP, Lipids  If you have labs (blood work) drawn today and your tests are completely normal, you will receive your results only by: [place] (if you have [place]) OR A paper copy in the mail If you have any lab test that is abnormal or we need to change your treatment, we will call you to review the results.   Testing/Procedures: None   Follow-Up: At [facility], you and your health needs are our priority.  As part of our continuing mission to provide you with exceptional heart care, we have created designated Provider Care Teams.  These Care Teams include your primary Cardiologist (physician) and Advanced Practice Providers (APPs -  Physician Assistants and Nurse Practitioners) who all work together to provide you with the care you need, when you need it.  We recommend signing up for the patient portal called "MyChart".  Sign up information is provided on this After Visit Summary.  MyChart is used to connect with patients for Virtual Visits (Telemedicine).  Patients are able to view lab/test results, encounter notes, upcoming appointments, etc.  Non-urgent messages can be sent to your provider as well.   To learn more about what you can do with MyChart, go to [email or web address].    Your next appointment:   1 year(s)  Provider:   Shirlee More, MD    Other Instructions Check blood pressure daily and record. Drop off a list of blood pressures to the office in 2 weeks.

## 2023-04-05 LAB — LIPID PANEL
Chol/HDL Ratio: 3.5 ratio (ref 0.0–4.4)
Cholesterol, Total: 192 mg/dL (ref 100–199)
HDL: 55 mg/dL (ref >39)
LDL Chol Calc (NIH): 108 mg/dL — ABNORMAL HIGH (ref 0–99)
Triglycerides: 170 mg/dL — ABNORMAL HIGH (ref 0–149)
VLDL Cholesterol Cal: 29 mg/dL (ref 5–40)

## 2023-04-05 LAB — COMPREHENSIVE METABOLIC PANEL
ALT: 51 IU/L — ABNORMAL HIGH (ref 0–32)
AST: 41 IU/L — ABNORMAL HIGH (ref 0–40)
Albumin/Globulin Ratio: 1.6 (ref 1.2–2.2)
Albumin: 4.7 g/dL (ref 3.8–4.8)
Alkaline Phosphatase: 97 IU/L (ref 44–121)
BUN/Creatinine Ratio: 19 (ref 12–28)
BUN: 15 mg/dL (ref 8–27)
Bilirubin Total: 0.4 mg/dL (ref 0.0–1.2)
CO2: 21 mmol/L (ref 20–29)
Calcium: 9.8 mg/dL (ref 8.7–10.3)
Chloride: 103 mmol/L (ref 96–106)
Creatinine, Ser: 0.79 mg/dL (ref 0.57–1.00)
Globulin, Total: 2.9 g/dL (ref 1.5–4.5)
Glucose: 94 mg/dL (ref 70–99)
Potassium: 4.3 mmol/L (ref 3.5–5.2)
Sodium: 143 mmol/L (ref 134–144)
Total Protein: 7.6 g/dL (ref 6.0–8.5)
eGFR: 78 mL/min/{1.73_m2} (ref >59)

## 2023-04-08 DIAGNOSIS — M9901 Segmental and somatic dysfunction of cervical region: Secondary | ICD-10-CM | POA: Diagnosis not present

## 2023-04-08 DIAGNOSIS — M5030 Other cervical disc degeneration, unspecified cervical region: Secondary | ICD-10-CM | POA: Diagnosis not present

## 2023-04-18 ENCOUNTER — Other Ambulatory Visit: Payer: Self-pay | Admitting: Cardiology

## 2023-04-18 NOTE — Telephone Encounter (Signed)
Refill to pharmacy 

## 2023-05-06 DIAGNOSIS — M9901 Segmental and somatic dysfunction of cervical region: Secondary | ICD-10-CM | POA: Diagnosis not present

## 2023-05-06 DIAGNOSIS — M5030 Other cervical disc degeneration, unspecified cervical region: Secondary | ICD-10-CM | POA: Diagnosis not present

## 2023-05-13 ENCOUNTER — Telehealth: Payer: Self-pay

## 2023-05-13 NOTE — Telephone Encounter (Signed)
Patient brought home blood pressure log for dates 04/05/2023 through 05/11/2023 for Dr Dulce Sellar to review. I have placed in folder for his review

## 2023-06-10 DIAGNOSIS — M9901 Segmental and somatic dysfunction of cervical region: Secondary | ICD-10-CM | POA: Diagnosis not present

## 2023-06-10 DIAGNOSIS — M5030 Other cervical disc degeneration, unspecified cervical region: Secondary | ICD-10-CM | POA: Diagnosis not present

## 2023-06-19 DIAGNOSIS — L814 Other melanin hyperpigmentation: Secondary | ICD-10-CM | POA: Diagnosis not present

## 2023-06-19 DIAGNOSIS — L570 Actinic keratosis: Secondary | ICD-10-CM | POA: Diagnosis not present

## 2023-06-19 DIAGNOSIS — L578 Other skin changes due to chronic exposure to nonionizing radiation: Secondary | ICD-10-CM | POA: Diagnosis not present

## 2023-06-19 DIAGNOSIS — L821 Other seborrheic keratosis: Secondary | ICD-10-CM | POA: Diagnosis not present

## 2023-06-19 DIAGNOSIS — D225 Melanocytic nevi of trunk: Secondary | ICD-10-CM | POA: Diagnosis not present

## 2023-07-08 DIAGNOSIS — M5030 Other cervical disc degeneration, unspecified cervical region: Secondary | ICD-10-CM | POA: Diagnosis not present

## 2023-07-08 DIAGNOSIS — M9901 Segmental and somatic dysfunction of cervical region: Secondary | ICD-10-CM | POA: Diagnosis not present

## 2023-08-07 DIAGNOSIS — I10 Essential (primary) hypertension: Secondary | ICD-10-CM | POA: Diagnosis not present

## 2023-08-07 DIAGNOSIS — I6930 Unspecified sequelae of cerebral infarction: Secondary | ICD-10-CM | POA: Diagnosis not present

## 2023-08-07 DIAGNOSIS — R7302 Impaired glucose tolerance (oral): Secondary | ICD-10-CM | POA: Diagnosis not present

## 2023-08-07 DIAGNOSIS — I700 Atherosclerosis of aorta: Secondary | ICD-10-CM | POA: Diagnosis not present

## 2023-08-07 DIAGNOSIS — Z8673 Personal history of transient ischemic attack (TIA), and cerebral infarction without residual deficits: Secondary | ICD-10-CM | POA: Diagnosis not present

## 2023-08-07 DIAGNOSIS — E785 Hyperlipidemia, unspecified: Secondary | ICD-10-CM | POA: Diagnosis not present

## 2023-08-07 DIAGNOSIS — Z0000 Encounter for general adult medical examination without abnormal findings: Secondary | ICD-10-CM | POA: Diagnosis not present

## 2023-08-07 DIAGNOSIS — Z87898 Personal history of other specified conditions: Secondary | ICD-10-CM | POA: Diagnosis not present

## 2023-08-07 DIAGNOSIS — Z79899 Other long term (current) drug therapy: Secondary | ICD-10-CM | POA: Diagnosis not present

## 2023-08-08 DIAGNOSIS — M25872 Other specified joint disorders, left ankle and foot: Secondary | ICD-10-CM | POA: Diagnosis not present

## 2023-08-08 DIAGNOSIS — R200 Anesthesia of skin: Secondary | ICD-10-CM | POA: Insufficient documentation

## 2023-08-19 DIAGNOSIS — M9901 Segmental and somatic dysfunction of cervical region: Secondary | ICD-10-CM | POA: Diagnosis not present

## 2023-08-19 DIAGNOSIS — M5030 Other cervical disc degeneration, unspecified cervical region: Secondary | ICD-10-CM | POA: Diagnosis not present

## 2023-08-29 DIAGNOSIS — R200 Anesthesia of skin: Secondary | ICD-10-CM | POA: Diagnosis not present

## 2023-08-29 DIAGNOSIS — M25872 Other specified joint disorders, left ankle and foot: Secondary | ICD-10-CM | POA: Diagnosis not present

## 2023-09-04 DIAGNOSIS — Z23 Encounter for immunization: Secondary | ICD-10-CM | POA: Diagnosis not present

## 2023-09-23 DIAGNOSIS — M5030 Other cervical disc degeneration, unspecified cervical region: Secondary | ICD-10-CM | POA: Diagnosis not present

## 2023-09-23 DIAGNOSIS — M9901 Segmental and somatic dysfunction of cervical region: Secondary | ICD-10-CM | POA: Diagnosis not present

## 2023-10-05 DIAGNOSIS — J069 Acute upper respiratory infection, unspecified: Secondary | ICD-10-CM | POA: Diagnosis not present

## 2023-10-05 DIAGNOSIS — R1110 Vomiting, unspecified: Secondary | ICD-10-CM | POA: Diagnosis not present

## 2023-10-05 DIAGNOSIS — R509 Fever, unspecified: Secondary | ICD-10-CM | POA: Diagnosis not present

## 2023-10-05 DIAGNOSIS — J029 Acute pharyngitis, unspecified: Secondary | ICD-10-CM | POA: Diagnosis not present

## 2023-10-05 DIAGNOSIS — R059 Cough, unspecified: Secondary | ICD-10-CM | POA: Diagnosis not present

## 2023-11-04 DIAGNOSIS — M9901 Segmental and somatic dysfunction of cervical region: Secondary | ICD-10-CM | POA: Diagnosis not present

## 2023-11-04 DIAGNOSIS — M5030 Other cervical disc degeneration, unspecified cervical region: Secondary | ICD-10-CM | POA: Diagnosis not present

## 2023-11-07 DIAGNOSIS — Z79899 Other long term (current) drug therapy: Secondary | ICD-10-CM | POA: Diagnosis not present

## 2023-11-07 DIAGNOSIS — R7302 Impaired glucose tolerance (oral): Secondary | ICD-10-CM | POA: Diagnosis not present

## 2023-11-07 DIAGNOSIS — E785 Hyperlipidemia, unspecified: Secondary | ICD-10-CM | POA: Diagnosis not present

## 2023-11-14 DIAGNOSIS — E538 Deficiency of other specified B group vitamins: Secondary | ICD-10-CM | POA: Diagnosis not present

## 2023-11-14 DIAGNOSIS — B9789 Other viral agents as the cause of diseases classified elsewhere: Secondary | ICD-10-CM | POA: Diagnosis not present

## 2023-11-14 DIAGNOSIS — J988 Other specified respiratory disorders: Secondary | ICD-10-CM | POA: Diagnosis not present

## 2023-12-02 DIAGNOSIS — M5030 Other cervical disc degeneration, unspecified cervical region: Secondary | ICD-10-CM | POA: Diagnosis not present

## 2023-12-02 DIAGNOSIS — M9901 Segmental and somatic dysfunction of cervical region: Secondary | ICD-10-CM | POA: Diagnosis not present

## 2023-12-04 DIAGNOSIS — M25561 Pain in right knee: Secondary | ICD-10-CM | POA: Diagnosis not present

## 2024-01-10 DIAGNOSIS — Z1231 Encounter for screening mammogram for malignant neoplasm of breast: Secondary | ICD-10-CM | POA: Diagnosis not present

## 2024-01-14 DIAGNOSIS — M9901 Segmental and somatic dysfunction of cervical region: Secondary | ICD-10-CM | POA: Diagnosis not present

## 2024-01-14 DIAGNOSIS — M5030 Other cervical disc degeneration, unspecified cervical region: Secondary | ICD-10-CM | POA: Diagnosis not present

## 2024-01-15 DIAGNOSIS — L821 Other seborrheic keratosis: Secondary | ICD-10-CM | POA: Diagnosis not present

## 2024-01-15 DIAGNOSIS — D485 Neoplasm of uncertain behavior of skin: Secondary | ICD-10-CM | POA: Diagnosis not present

## 2024-01-15 DIAGNOSIS — L820 Inflamed seborrheic keratosis: Secondary | ICD-10-CM | POA: Diagnosis not present

## 2024-01-23 DIAGNOSIS — Z1231 Encounter for screening mammogram for malignant neoplasm of breast: Secondary | ICD-10-CM | POA: Diagnosis not present

## 2024-02-12 DIAGNOSIS — M25561 Pain in right knee: Secondary | ICD-10-CM | POA: Diagnosis not present

## 2024-02-13 DIAGNOSIS — S83271A Complex tear of lateral meniscus, current injury, right knee, initial encounter: Secondary | ICD-10-CM | POA: Diagnosis not present

## 2024-02-17 DIAGNOSIS — M1711 Unilateral primary osteoarthritis, right knee: Secondary | ICD-10-CM | POA: Diagnosis not present

## 2024-02-25 DIAGNOSIS — M9901 Segmental and somatic dysfunction of cervical region: Secondary | ICD-10-CM | POA: Diagnosis not present

## 2024-02-25 DIAGNOSIS — M5030 Other cervical disc degeneration, unspecified cervical region: Secondary | ICD-10-CM | POA: Diagnosis not present

## 2024-03-10 DIAGNOSIS — M25561 Pain in right knee: Secondary | ICD-10-CM | POA: Diagnosis not present

## 2024-03-10 DIAGNOSIS — G8929 Other chronic pain: Secondary | ICD-10-CM | POA: Diagnosis not present

## 2024-03-14 ENCOUNTER — Other Ambulatory Visit: Payer: Self-pay | Admitting: Cardiology

## 2024-03-16 ENCOUNTER — Encounter: Payer: Self-pay | Admitting: *Deleted

## 2024-03-16 NOTE — Telephone Encounter (Signed)
 Rx refill sent to pharmacy.

## 2024-04-01 DIAGNOSIS — J302 Other seasonal allergic rhinitis: Secondary | ICD-10-CM | POA: Diagnosis not present

## 2024-04-04 NOTE — Progress Notes (Unsigned)
  Cardiology Office Note:  .   Date:  04/06/2024  ID:  Samantha Wilkinson, DOB March 24, 1946, MRN [id number] PCP: Street, Stephanie Coup, MD  [facility] Providers Cardiologist:  Norman Herrlich, MD    History of Present Illness: .   Samantha Wilkinson is a 78 y.o. female with a past medical history of stroke, hypertension, migraines, ulcerative colitis, dyslipidemia with statin intolerance, history of seizure.  04/10/2022 carotid duplex no evidence of stenosis 2017 ischemic evaluation normal 2015 stroke  She established care with Dr. Dulce Sellar for management of her hypertension and dyslipidemia.  She was most recently evaluated by Dr. Dulce Sellar in April 2024, she was stable from a cardiac perspective, no changes were made to her medications and plan of care and she was advised to follow-up in a year.  She presents today for follow-up of her hypertension.  She has been doing well since she was last evaluated in our office.  She has sold her property and moved into a [place] at [place], enjoying this immensely.  She stays very physically active, offers no formal complaints from a cardiac perspective.  She no longer follows with neurology, has not had any subsequent events since her stroke in 2015. She denies chest pain, palpitations, dyspnea, pnd, orthopnea, n, v, dizziness, syncope, edema, weight gain, or early satiety.   ROS: Review of Systems  All other systems reviewed and are negative.    Studies Reviewed: .            Risk Assessment/Calculations:             Physical Exam:   VS:  BP 134/82   Pulse 79   Ht 5\' 3"  (1.6 m)   Wt 146 lb 9.6 oz (66.5 kg)   BMI 25.97 kg/m    Wt Readings from Last 3 Encounters:  04/06/24 146 lb 9.6 oz (66.5 kg)  04/04/23 152 lb (68.9 kg)  04/02/22 152 lb 3.2 oz (69 kg)    GEN: Well nourished, well developed in no acute distress NECK: No JVD; No carotid bruits CARDIAC: RRR, no murmurs, rubs, gallops RESPIRATORY:  Clear to auscultation without rales, wheezing or  rhonchi  ABDOMEN: Soft, non-tender, non-distended EXTREMITIES:  No edema; No deformity   ASSESSMENT AND PLAN: .   Hypertension-blood pressure is controlled at 134/82, continue Norvasc 5 mg daily, continue Diovan 80 mg daily.  History of stroke-neurologically intact, continue aspirin 81 mg daily, continue Plavix 75 mg daily, continue Pravachol 20 mg daily, continue Zetia 10 mg daily.  Will check CBC today.  Dyslipidemia with statin intolerance-will repeat FLP and LFTs today, continue Zetia 10 mg daily, continue Pravachol 20 mg daily.       Dispo: CBC, CMET, fasting lipid panel, follow-up in 1 year.  Signed, Flossie Dibble, NP

## 2024-04-06 ENCOUNTER — Encounter: Payer: Self-pay | Admitting: Cardiology

## 2024-04-06 ENCOUNTER — Ambulatory Visit: Attending: Cardiology | Admitting: Cardiology

## 2024-04-06 VITALS — BP 134/82 | HR 79 | Ht 63.0 in | Wt 146.6 lb

## 2024-04-06 DIAGNOSIS — E782 Mixed hyperlipidemia: Secondary | ICD-10-CM

## 2024-04-06 DIAGNOSIS — I10 Essential (primary) hypertension: Secondary | ICD-10-CM | POA: Diagnosis not present

## 2024-04-06 DIAGNOSIS — I679 Cerebrovascular disease, unspecified: Secondary | ICD-10-CM

## 2024-04-06 DIAGNOSIS — Z789 Other specified health status: Secondary | ICD-10-CM | POA: Diagnosis not present

## 2024-04-06 DIAGNOSIS — I639 Cerebral infarction, unspecified: Secondary | ICD-10-CM | POA: Diagnosis not present

## 2024-04-06 NOTE — Patient Instructions (Signed)
 Medication Instructions:  Your physician recommends that you continue on your current medications as directed. Please refer to the Current Medication list given to you today.  *If you need a refill on your cardiac medications before your next appointment, please call your pharmacy*   Lab Work: Your physician recommends that you have labs done in the office today. Your test included  CMP, CBC and lipids.   If you have labs (blood work) drawn today and your tests are completely normal, you will receive your results only by: MyChart Message (if you have MyChart) OR A paper copy in the mail If you have any lab test that is abnormal or we need to change your treatment, we will call you to review the results.   Testing/Procedures: None Ordered   Follow-Up: At [facility], you and your health needs are our priority.  As part of our continuing mission to provide you with exceptional heart care, we have created designated Provider Care Teams.  These Care Teams include your primary Cardiologist (physician) and Advanced Practice Providers (APPs -  Physician Assistants and Nurse Practitioners) who all work together to provide you with the care you need, when you need it.  We recommend signing up for the patient portal called "MyChart".  Sign up information is provided on this After Visit Summary.  MyChart is used to connect with patients for Virtual Visits (Telemedicine).  Patients are able to view lab/test results, encounter notes, upcoming appointments, etc.  Non-urgent messages can be sent to your provider as well.   To learn more about what you can do with MyChart, go to [email or web address].    Your next appointment:   1 year

## 2024-04-07 ENCOUNTER — Telehealth: Payer: Self-pay | Admitting: Emergency Medicine

## 2024-04-07 DIAGNOSIS — E782 Mixed hyperlipidemia: Secondary | ICD-10-CM

## 2024-04-07 LAB — COMPREHENSIVE METABOLIC PANEL WITH GFR
ALT: 35 IU/L — ABNORMAL HIGH (ref 0–32)
AST: 29 IU/L (ref 0–40)
Albumin: 4.7 g/dL (ref 3.8–4.8)
Alkaline Phosphatase: 90 IU/L (ref 44–121)
BUN/Creatinine Ratio: 19 (ref 12–28)
BUN: 15 mg/dL (ref 8–27)
Bilirubin Total: 0.5 mg/dL (ref 0.0–1.2)
CO2: 21 mmol/L (ref 20–29)
Calcium: 10.1 mg/dL (ref 8.7–10.3)
Chloride: 99 mmol/L (ref 96–106)
Creatinine, Ser: 0.77 mg/dL (ref 0.57–1.00)
Globulin, Total: 2.7 g/dL (ref 1.5–4.5)
Glucose: 83 mg/dL (ref 70–99)
Potassium: 4.3 mmol/L (ref 3.5–5.2)
Sodium: 138 mmol/L (ref 134–144)
Total Protein: 7.4 g/dL (ref 6.0–8.5)
eGFR: 80 mL/min/1.73

## 2024-04-07 LAB — CBC
Hematocrit: 39.8 % (ref 34.0–46.6)
Hemoglobin: 13.8 g/dL (ref 11.1–15.9)
MCH: 30.7 pg (ref 26.6–33.0)
MCHC: 34.7 g/dL (ref 31.5–35.7)
MCV: 88 fL (ref 79–97)
Platelets: 320 x10E3/uL (ref 150–450)
RBC: 4.5 x10E6/uL (ref 3.77–5.28)
RDW: 12.2 % (ref 11.7–15.4)
WBC: 11.3 x10E3/uL — ABNORMAL HIGH (ref 3.4–10.8)

## 2024-04-07 LAB — LIPID PANEL
Chol/HDL Ratio: 2.8 ratio (ref 0.0–4.4)
Cholesterol, Total: 186 mg/dL (ref 100–199)
HDL: 66 mg/dL
LDL Chol Calc (NIH): 105 mg/dL — ABNORMAL HIGH (ref 0–99)
Triglycerides: 85 mg/dL (ref 0–149)
VLDL Cholesterol Cal: 15 mg/dL (ref 5–40)

## 2024-04-07 MED ORDER — PRAVASTATIN SODIUM 40 MG PO TABS: 40.0000 mg | ORAL_TABLET | Freq: Every evening | ORAL | 3 refills | Status: AC

## 2024-04-07 NOTE — Telephone Encounter (Signed)
-----   Message from Flossie Dibble sent at 04/07/2024  7:55 AM EDT ----- WBCs slightly elevated.  LDL is elevated, recommend increasing Pravachol to every evening and then repeating FLP and LFTs in 6 weeks.   I have already forwarded to Dr. Casper Harrison.

## 2024-04-07 NOTE — Telephone Encounter (Signed)
 Called and spoke to patient. Reviewed lab results with patient as per [facility] note. Reviewed Jennifer's recommendations with patient. Patient verbalized understanding and had no further questions.

## 2024-04-07 NOTE — Addendum Note (Signed)
 Addended by: Lonia Farber on: 04/07/2024 05:10 PM   Modules accepted: Orders

## 2024-04-08 ENCOUNTER — Ambulatory Visit: Payer: Medicare PPO | Admitting: Cardiology

## 2024-04-13 DIAGNOSIS — M9901 Segmental and somatic dysfunction of cervical region: Secondary | ICD-10-CM | POA: Diagnosis not present

## 2024-04-13 DIAGNOSIS — M5030 Other cervical disc degeneration, unspecified cervical region: Secondary | ICD-10-CM | POA: Diagnosis not present

## 2024-04-18 ENCOUNTER — Other Ambulatory Visit: Payer: Self-pay | Admitting: Cardiology

## 2024-04-20 NOTE — Telephone Encounter (Signed)
 Rx refill sent to pharmacy.

## 2024-05-19 DIAGNOSIS — E782 Mixed hyperlipidemia: Secondary | ICD-10-CM | POA: Diagnosis not present

## 2024-05-20 LAB — HEPATIC FUNCTION PANEL
ALT: 27 IU/L (ref 0–32)
AST: 22 IU/L (ref 0–40)
Albumin: 4.7 g/dL (ref 3.8–4.8)
Alkaline Phosphatase: 74 IU/L (ref 44–121)
Bilirubin Total: 0.3 mg/dL (ref 0.0–1.2)
Bilirubin, Direct: 0.12 mg/dL (ref 0.00–0.40)
Total Protein: 7.2 g/dL (ref 6.0–8.5)

## 2024-05-20 LAB — LIPID PANEL
Chol/HDL Ratio: 2.8 ratio (ref 0.0–4.4)
Cholesterol, Total: 188 mg/dL (ref 100–199)
HDL: 67 mg/dL
LDL Chol Calc (NIH): 105 mg/dL — ABNORMAL HIGH (ref 0–99)
Triglycerides: 91 mg/dL (ref 0–149)
VLDL Cholesterol Cal: 16 mg/dL (ref 5–40)

## 2024-05-25 DIAGNOSIS — M9901 Segmental and somatic dysfunction of cervical region: Secondary | ICD-10-CM | POA: Diagnosis not present

## 2024-05-25 DIAGNOSIS — M5030 Other cervical disc degeneration, unspecified cervical region: Secondary | ICD-10-CM | POA: Diagnosis not present

## 2024-06-17 DIAGNOSIS — M25562 Pain in left knee: Secondary | ICD-10-CM | POA: Diagnosis not present

## 2024-06-17 DIAGNOSIS — M1712 Unilateral primary osteoarthritis, left knee: Secondary | ICD-10-CM | POA: Diagnosis not present

## 2024-06-29 ENCOUNTER — Other Ambulatory Visit: Payer: Self-pay | Admitting: Cardiology

## 2024-06-29 NOTE — Telephone Encounter (Signed)
 Rx refill sent to pharmacy.

## 2024-06-30 DIAGNOSIS — H9201 Otalgia, right ear: Secondary | ICD-10-CM | POA: Diagnosis not present

## 2024-06-30 DIAGNOSIS — J029 Acute pharyngitis, unspecified: Secondary | ICD-10-CM | POA: Diagnosis not present

## 2024-07-13 DIAGNOSIS — J302 Other seasonal allergic rhinitis: Secondary | ICD-10-CM | POA: Diagnosis not present

## 2024-07-22 DIAGNOSIS — M9901 Segmental and somatic dysfunction of cervical region: Secondary | ICD-10-CM | POA: Diagnosis not present

## 2024-07-22 DIAGNOSIS — M5030 Other cervical disc degeneration, unspecified cervical region: Secondary | ICD-10-CM | POA: Diagnosis not present

## 2024-08-11 DIAGNOSIS — E559 Vitamin D deficiency, unspecified: Secondary | ICD-10-CM | POA: Diagnosis not present

## 2024-08-11 DIAGNOSIS — Z79899 Other long term (current) drug therapy: Secondary | ICD-10-CM | POA: Diagnosis not present

## 2024-08-11 DIAGNOSIS — Z0000 Encounter for general adult medical examination without abnormal findings: Secondary | ICD-10-CM | POA: Diagnosis not present

## 2024-08-11 DIAGNOSIS — I10 Essential (primary) hypertension: Secondary | ICD-10-CM | POA: Diagnosis not present

## 2024-08-11 DIAGNOSIS — L570 Actinic keratosis: Secondary | ICD-10-CM | POA: Diagnosis not present

## 2024-08-11 DIAGNOSIS — I6930 Unspecified sequelae of cerebral infarction: Secondary | ICD-10-CM | POA: Diagnosis not present

## 2024-08-11 DIAGNOSIS — M8580 Other specified disorders of bone density and structure, unspecified site: Secondary | ICD-10-CM | POA: Diagnosis not present

## 2024-08-11 DIAGNOSIS — E785 Hyperlipidemia, unspecified: Secondary | ICD-10-CM | POA: Diagnosis not present

## 2024-08-11 DIAGNOSIS — Z131 Encounter for screening for diabetes mellitus: Secondary | ICD-10-CM | POA: Diagnosis not present

## 2024-08-11 DIAGNOSIS — Z8673 Personal history of transient ischemic attack (TIA), and cerebral infarction without residual deficits: Secondary | ICD-10-CM | POA: Diagnosis not present

## 2024-09-08 DIAGNOSIS — L821 Other seborrheic keratosis: Secondary | ICD-10-CM | POA: Diagnosis not present

## 2024-09-14 DIAGNOSIS — M5030 Other cervical disc degeneration, unspecified cervical region: Secondary | ICD-10-CM | POA: Diagnosis not present

## 2024-09-14 DIAGNOSIS — M9901 Segmental and somatic dysfunction of cervical region: Secondary | ICD-10-CM | POA: Diagnosis not present

## 2024-10-26 DIAGNOSIS — M9901 Segmental and somatic dysfunction of cervical region: Secondary | ICD-10-CM | POA: Diagnosis not present

## 2024-10-26 DIAGNOSIS — M5030 Other cervical disc degeneration, unspecified cervical region: Secondary | ICD-10-CM | POA: Diagnosis not present

## 2024-11-28 ENCOUNTER — Encounter: Payer: Self-pay | Admitting: Gastroenterology

## 2024-12-07 DIAGNOSIS — M9901 Segmental and somatic dysfunction of cervical region: Secondary | ICD-10-CM | POA: Diagnosis not present

## 2024-12-07 DIAGNOSIS — M5030 Other cervical disc degeneration, unspecified cervical region: Secondary | ICD-10-CM | POA: Diagnosis not present
